# Patient Record
Sex: Male | Born: 1995 | Race: White | Hispanic: No | Marital: Single | Smoking: Never smoker
Health system: Southern US, Community
[De-identification: ages and names within clinical notes are randomized; demographics above are authoritative.]

## PROBLEM LIST (undated history)

## (undated) DIAGNOSIS — F909 Attention-deficit hyperactivity disorder, unspecified type: Secondary | ICD-10-CM

## (undated) DIAGNOSIS — F419 Anxiety disorder, unspecified: Secondary | ICD-10-CM

## (undated) HISTORY — DX: Attention-deficit hyperactivity disorder, unspecified type: F90.9

## (undated) HISTORY — DX: Anxiety disorder, unspecified: F41.9

## (undated) HISTORY — PX: TONSILLECTOMY: SUR1361

---

## 2019-01-11 ENCOUNTER — Other Ambulatory Visit: Payer: Self-pay

## 2019-01-11 ENCOUNTER — Ambulatory Visit: Payer: 59 | Admitting: Psychiatry

## 2019-01-11 ENCOUNTER — Encounter: Payer: Self-pay | Admitting: Psychiatry

## 2019-01-11 VITALS — BP 160/83 | HR 92 | Temp 98.0°F | Ht 67.32 in | Wt 217.2 lb

## 2019-01-11 DIAGNOSIS — Z8659 Personal history of other mental and behavioral disorders: Secondary | ICD-10-CM

## 2019-01-11 DIAGNOSIS — F411 Generalized anxiety disorder: Secondary | ICD-10-CM | POA: Insufficient documentation

## 2019-01-11 DIAGNOSIS — F410 Panic disorder [episodic paroxysmal anxiety] without agoraphobia: Secondary | ICD-10-CM | POA: Diagnosis not present

## 2019-01-11 DIAGNOSIS — J45990 Exercise induced bronchospasm: Secondary | ICD-10-CM | POA: Insufficient documentation

## 2019-01-11 DIAGNOSIS — F5105 Insomnia due to other mental disorder: Secondary | ICD-10-CM | POA: Diagnosis not present

## 2019-01-11 MED ORDER — GUANFACINE HCL ER 1 MG PO TB24: 1.0000 mg | ORAL_TABLET | Freq: Every day | ORAL | 1 refills | Status: DC

## 2019-01-11 MED ORDER — ESCITALOPRAM OXALATE 10 MG PO TABS: 10.0000 mg | ORAL_TABLET | Freq: Every day | ORAL | 0 refills | Status: DC

## 2019-01-11 MED ORDER — HYDROXYZINE HCL 25 MG PO TABS: 12.5000 mg | ORAL_TABLET | Freq: Two times a day (BID) | ORAL | 1 refills | Status: DC | PRN

## 2019-01-11 NOTE — Progress Notes (Signed)
Psychiatric Initial Adult Assessment   Patient Identification: Terrance Daniels MRN:  [id number] Date of Evaluation:  01/11/2019 Referral Source: Marisue Ivan MD Chief Complaint:  ' I am here to establish care." Chief Complaint    Establish Care; Anxiety; Depression; Insomnia; ADHD     Visit Diagnosis:    ICD-10-CM   1. GAD (generalized anxiety disorder) F41.1 guanFACINE (INTUNIV) 1 MG TB24 ER tablet  2. Panic attacks F41.0   3. Insomnia due to mental disorder F51.05   4. History of ADHD Z86.59     History of Present Illness:  Terrance Daniels is a 23 yr old Caucasian male, lives in [place] with his mother, employed, has a history of anxiety disorder, ADHD, presented to clinic today to establish care.  Patient reports he was being treated by his primary medical doctor for anxiety symptoms since the past few months.  He reports he was initially tried on Zoloft which worked for a while.  He reports later on he was tried on Effexor which caused him side effects of racing heart rate.  2 weeks ago he was started on Lexapro and is currently on 10 mg.  He reports he does not see much benefit from the Lexapro although it is not causing any side effects.  He continues to struggle with anxiety, racing heart rate, chest pain, restlessness, being excessively worried about different things on a regular basis .  Patient reports panic attacks which can happen randomly and can last for a few minutes.  He denies avoiding any situations because of his panic symptoms.  He reports he has been struggling with this since the past 1 year or more.  He reports panic attacks is worsening.  Patient reports sleep is restless.  He reports he has no set bedtime.  He reports he was to bed at 2 AM or 3 AM in the morning.  He reports he plays on his phone when he cannot sleep.  This has been going on since the past several months.  Patient denies any depressive symptoms.  He denies any history of trauma.  He does report a  history of ADHD, diagnosed as a child.  He reports he has been on medications like Ritalin previously.  He continues to struggle with hyperactivity, restlessness, inability to focus, trouble organizing task and so on on a regular basis.  Patient denies any suicidality, homicidality or perceptual disturbances.  Patient denies any substance abuse problems.  Patient reports he has good support system from his mother and his girlfriend.  Associated Signs/Symptoms: Depression Symptoms:  denies (Hypo) Manic Symptoms:  denies Anxiety Symptoms:  Excessive Worry, Panic Symptoms, Psychotic Symptoms:  denies PTSD Symptoms: Negative  Past Psychiatric History: Patient was being treated for anxiety disorder by his primary medical doctor.  He also has a history of ADHD.  Previous Psychotropic Medications: Yes Zoloft, Effexor-racing heart rate, Ritalin  Substance Abuse History in the last 12 months:  No.  Consequences of Substance Abuse: Negative  Past Medical History:  Past Medical History:  Diagnosis Date  . ADHD (attention deficit hyperactivity disorder)   . Anxiety     Past Surgical History:  Procedure Laterality Date  . TONSILLECTOMY      Family Psychiatric History: Mother-anxiety disorder  Family History:  Family History  Problem Relation Age of Onset  . Anxiety disorder Mother     Social History:   Social History   Socioeconomic History  . Marital status: Single    Spouse name: Not on file  .  Number of children: 0  . Years of education: Not on file  . Highest education level: High school graduate  Occupational History  . Not on file  Social Needs  . Financial resource strain: Not hard at all  . Food insecurity:    Worry: Never true    Inability: Never true  . Transportation needs:    Medical: No    Non-medical: No  Tobacco Use  . Smoking status: Never Smoker  . Smokeless tobacco: Never Used  Substance and Sexual Activity  . Alcohol use: Not Currently  .  Drug use: Not Currently    Types: Marijuana    Comment: about 3 months  . Sexual activity: Yes  Lifestyle  . Physical activity:    Days per week: 0 days    Minutes per session: 0 min  . Stress: Very much  Relationships  . Social connections:    Talks on phone: Not on file    Gets together: Not on file    Attends religious service: Never    Active member of club or organization: No    Attends meetings of clubs or organizations: Never    Relationship status: Never married  Other Topics Concern  . Not on file  Social History Narrative  . Not on file    Additional Social History: Patient lives with his mother.  He has a girlfriend.  He works at the [employer].  He lives in [place].  Graduated high school.   Allergies:  No Known Allergies  Metabolic Disorder Labs: No results found for: HGBA1C, MPG No results found for: PROLACTIN No results found for: CHOL, TRIG, HDL, CHOLHDL, VLDL, LDLCALC No results found for: TSH  Therapeutic Level Labs: No results found for: LITHIUM No results found for: CBMZ No results found for: VALPROATE  Current Medications: Current Outpatient Medications  Medication Sig Dispense Refill  . escitalopram (LEXAPRO) 10 MG tablet Take 1 tablet (10 mg total) by mouth daily. 30 tablet 0  . famotidine (PEPCID) 20 MG tablet     . ibuprofen (ADVIL,MOTRIN) 200 MG tablet Take by mouth.    . Melatonin 3 MG TABS Take by mouth.    . guanFACINE (INTUNIV) 1 MG TB24 ER tablet Take 1 tablet (1 mg total) by mouth at bedtime. FOR ANXIETY,CONCENTRATION 30 tablet 1  . hydrOXYzine (ATARAX/VISTARIL) 25 MG tablet Take 0.5-1 tablets (12.5-25 mg total) by mouth 2 (two) times daily as needed. FOR SEVERE ANXIETY ATTACKS ONLY 60 tablet 1   No current facility-administered medications for this visit.     Musculoskeletal: Strength & Muscle Tone: within normal limits Gait & Station: normal Patient leans: N/A  Psychiatric Specialty Exam: Review of Systems   Psychiatric/Behavioral: The patient is nervous/anxious and has insomnia.   All other systems reviewed and are negative.   Blood pressure (!) 160/83, pulse 92, temperature 98 F (36.7 C), temperature source Oral, height 5' 7.32" (1.71 m), weight 217 lb 3.2 oz (98.5 kg).Body mass index is 33.69 kg/m.  General Appearance: Casual  Eye Contact:  Fair  Speech:  Clear and Coherent  Volume:  Normal  Mood:  Anxious  Affect:  Appropriate  Thought Process:  Goal Directed and Descriptions of Associations: Intact  Orientation:  Full (Time, Place, and Person)  Thought Content:  Logical  Suicidal Thoughts:  No  Homicidal Thoughts:  No  Memory:  Immediate;   Fair Recent;   Fair Remote;   Fair  Judgement:  Fair  Insight:  Fair  Psychomotor Activity:  Normal  Concentration:  Concentration: Fair and Attention Span: Fair  Recall:  [employer] of Knowledge:Fair  Language: Fair  Akathisia:  No  Handed:  Right  AIMS (if indicated): denies tremors, rigidity  Assets:  Communication Skills Desire for Improvement Social Support  ADL's:  Intact  Cognition: WNL  Sleep:  Poor   Screenings:   Assessment and Plan: Terrance Daniels is a 23 yr old Caucasian male who has a history of anxiety disorder, ADHD, presented to clinic today to establish care.  Patient is biologically predisposed given his history of mental health problems in his family.  Patient will benefit from medication changes as well as psychotherapy sessions.  Plan Generalized anxiety disorder GAD 7 equals 13 Continue Lexapro 10 mg p.o. daily.  Discussed with patient to try taking it in the morning. Refer for CBT.  Panic attacks Hydroxyzine 12.5 to 25 mg p.o. twice daily as needed Refer for CBT  For history of ADHD Discussed with patient to sign a release to obtain medical records from his provider who diagnosed him with ADHD. We will start Intuniv 1 mg p.o. nightly.  For insomnia Discussed sleep hygiene techniques.  Printed out copy and  gave to patient. Continue melatonin as needed.  I have reviewed medical records in [facility] per Dr. Burnadette Pop dated 12/09/2018-' generalized anxiety disorder-patient tapered off of the Effexor.  Advised to take Lexapro 10 mg p.o. daily.  Provided list of therapist.  Referral to psychiatry.,  Sleep disturbances-multifactorial-counseled on avoiding stimulants.'  Reviewed labs-[facility]-dated 11/20/2018-within normal limits.  Patient to make an appointment with therapist here in clinic.  Will refer him for the same.  Follow-up in clinic in 2 weeks or sooner if needed.  I have spent atleast 60 minutes  face to face with patient today. More than 50 % of the time was spent for psychoeducation and supportive psychotherapy and care coordination.  This note was generated in part or whole with voice recognition software. Voice recognition is usually quite accurate but there are transcription errors that can and very often do occur. I apologize for any typographical errors that were not detected and corrected.          Jomarie Longs, MD 2/24/20205:26 PM

## 2019-01-11 NOTE — Patient Instructions (Signed)
Hydroxyzine capsules or tablets What is this medicine? HYDROXYZINE (hye DROX i zeen) is an antihistamine. This medicine is used to treat allergy symptoms. It is also used to treat anxiety and tension. This medicine can be used with other medicines to induce sleep before surgery. This medicine may be used for other purposes; ask your health care provider or pharmacist if you have questions. COMMON BRAND NAME(S): ANX, Atarax, Rezine, Vistaril What should I tell my health care provider before I take this medicine? They need to know if you have any of these conditions: -glaucoma -heart disease -history of irregular heartbeat -kidney disease -liver disease -lung or breathing disease, like asthma -stomach or intestine problems -thyroid disease -trouble passing urine -an unusual or allergic reaction to hydroxyzine, cetirizine, other medicines, foods, dyes or preservatives -pregnant or trying to get pregnant -breast-feeding How should I use this medicine? Take this medicine by mouth with a full glass of water. Follow the directions on the prescription label. You may take this medicine with food or on an empty stomach. Take your medicine at regular intervals. Do not take your medicine more often than directed. Talk to your pediatrician regarding the use of this medicine in children. Special care may be needed. While this drug may be prescribed for children as young as 6 years of age for selected conditions, precautions do apply. Patients over 65 years old may have a stronger reaction and need a smaller dose. Overdosage: If you think you have taken too much of this medicine contact a poison control center or emergency room at once. NOTE: This medicine is only for you. Do not share this medicine with others. What if I miss a dose? If you miss a dose, take it as soon as you can. If it is almost time for your next dose, take only that dose. Do not take double or extra doses. What may interact with this  medicine? Do not take this medicine with any of the following medications: -cisapride -dofetilide -dronedarone -pimozide -thioridazine This medicine may also interact with the following medications: -alcohol -antihistamines for allergy, cough, and cold -atropine -barbiturate medicines for sleep or seizures, like phenobarbital -certain antibiotics like erythromycin or clarithromycin -certain medicines for anxiety or sleep -certain medicines for bladder problems like oxybutynin, tolterodine -certain medicines for depression or psychotic disturbances -certain medicines for irregular heart beat -certain medicines for Parkinson's disease like benztropine, trihexyphenidyl -certain medicines for seizures like phenobarbital, primidone -certain medicines for stomach problems like dicyclomine, hyoscyamine -certain medicines for travel sickness like scopolamine -ipratropium -narcotic medicines for pain -other medicines that prolong the QT interval (an abnormal heart rhythm) This list may not describe all possible interactions. Give your health care provider a list of all the medicines, herbs, non-prescription drugs, or dietary supplements you use. Also tell them if you smoke, drink alcohol, or use illegal drugs. Some items may interact with your medicine. What should I watch for while using this medicine? Tell your doctor or health care professional if your symptoms do not improve. You may get drowsy or dizzy. Do not drive, use machinery, or do anything that needs mental alertness until you know how this medicine affects you. Do not stand or sit up quickly, especially if you are an older patient. This reduces the risk of dizzy or fainting spells. Alcohol may interfere with the effect of this medicine. Avoid alcoholic drinks. Your mouth may get dry. Chewing sugarless gum or sucking hard candy, and drinking plenty of water may help. Contact your doctor if   the problem does not go away or is  severe. This medicine may cause dry eyes and blurred vision. If you wear contact lenses you may feel some discomfort. Lubricating drops may help. See your eye doctor if the problem does not go away or is severe. If you are receiving skin tests for allergies, tell your doctor you are using this medicine. What side effects may I notice from receiving this medicine? Side effects that you should report to your doctor or health care professional as soon as possible: -allergic reactions like skin rash, itching or hives, swelling of the face, lips, or tongue -changes in vision -confusion -fast, irregular heartbeat -seizures -tremor -trouble passing urine or change in the amount of urine Side effects that usually do not require medical attention (report to your doctor or health care professional if they continue or are bothersome): -constipation -drowsiness -dry mouth -headache -tiredness This list may not describe all possible side effects. Call your doctor for medical advice about side effects. You may report side effects to FDA at 1-800-FDA-1088. Where should I keep my medicine? Keep out of the reach of children. Store at room temperature between 15 and 30 degrees C (59 and 86 degrees F). Keep container tightly closed. Throw away any unused medicine after the expiration date. NOTE: This sheet is a summary. It may not cover all possible information. If you have questions about this medicine, talk to your doctor, pharmacist, or health care provider.  2019 Elsevier/Gold Standard (2018-05-18 13:25:13) Guanfacine extended-release oral tablets What is this medicine? GUANFACINE [facility] fa seen) is used to treat attention-deficit hyperactivity disorder (ADHD). This medicine may be used for other purposes; ask your health care provider or pharmacist if you have questions. COMMON BRAND NAME(S): Intuniv What should I tell my health care provider before I take this medicine? They need to know if you  have any of these conditions: -high blood pressure -kidney disease -liver disease -low blood pressure -slow heart rate -an unusual or allergic reaction to guanfacine, other medicines, foods, dyes, or preservatives -pregnant or trying to get pregnant -breast-feeding How should I use this medicine? Take this medicine by mouth with a glass of water. Follow the directions on the prescription label. Do not cut, crush, or chew this medicine. Do not take this medicine with a high-fat meal. Take your medicine at regular intervals. Do not take it more often than directed. Do not stop taking except on your doctor's advice. Stopping this medicine too quickly may cause serious side effects. Ask your doctor or health care professional for advice. This drug may be prescribed for children as young as 6 years. Talk to your doctor if you have any questions. Overdosage: If you think you have taken too much of this medicine contact a poison control center or emergency room at once. NOTE: This medicine is only for you. Do not share this medicine with others. What if I miss a dose? If you miss a dose, take it as soon as you can. If it is almost time for your next dose, take only that dose. Do not take double or extra doses. If you miss 2 or more doses in a row, you should contact your doctor or health care professional. You may need to restart your medicine at a lower dose. What may interact with this medicine? -certain medicines for blood pressure, heart disease, irregular heart beat -certain medicines for depression, anxiety, or psychotic disturbances -certain medicines for seizures like carbamazepine, phenobarbital, phenytoin -certain medicines for sleep -  ketoconazole -narcotic medicines for pain -rifampin This list may not describe all possible interactions. Give your health care provider a list of all the medicines, herbs, non-prescription drugs, or dietary supplements you use. Also tell them if you smoke,  drink alcohol, or use illegal drugs. Some items may interact with your medicine. What should I watch for while using this medicine? Visit your doctor or health care professional for regular checks on your progress. Check your heart rate and blood pressure as directed. Ask your doctor or health care professional what your heart rate and blood pressure should be and when you should contact him or her. You may get dizzy or drowsy. Do not drive, use machinery, or do anything that needs mental alertness until you know how this medicine affects you. Do not stand or sit up quickly, especially if you are an older patient. This reduces the risk of dizzy or fainting spells. Alcohol can make you more drowsy and dizzy. Avoid alcoholic drinks. Avoid becoming dehydrated or overheated while taking this medicine. Tell your healthcare provider if you have been vomiting and cannot take this medicine because you may be at risk for a sudden and large increase in blood pressure called rebound hypertension. Your mouth may get dry. Chewing sugarless gum or sucking hard candy, and drinking plenty of water may help. Contact your doctor if the problem does not go away or is severe. What side effects may I notice from receiving this medicine? Side effects that you should report to your doctor or health care professional as soon as possible: -allergic reactions like skin rash, itching or hives, swelling of the face, lips, or tongue -changes in emotions or moods -chest pain or chest tightness -signs and symptoms of low blood pressure like dizziness; feeling faint or lightheaded, falls; unusually weak or tired -unusually slow heartbeat Side effects that usually do not require medical attention (report to your doctor or health care professional if they continue or are bothersome): -drowsiness -dry mouth -headache -nausea -tiredness This list may not describe all possible side effects. Call your doctor for medical advice about  side effects. You may report side effects to FDA at 1-800-FDA-1088. Where should I keep my medicine? Keep out of the reach of children. Store at room temperature between 15 and 30 degrees C (59 and 86 degrees F). Throw away any unused medicine after the expiration date. NOTE: This sheet is a summary. It may not cover all possible information. If you have questions about this medicine, talk to your doctor, pharmacist, or health care provider.  2019 Elsevier/Gold Standard (2017-02-11 19:38:26)

## 2019-01-25 ENCOUNTER — Other Ambulatory Visit: Payer: Self-pay

## 2019-01-25 ENCOUNTER — Encounter: Payer: Self-pay | Admitting: Psychiatry

## 2019-01-25 ENCOUNTER — Ambulatory Visit: Payer: 59 | Admitting: Psychiatry

## 2019-01-25 VITALS — BP 155/79 | HR 105 | Temp 98.0°F | Wt 211.6 lb

## 2019-01-25 DIAGNOSIS — Z8659 Personal history of other mental and behavioral disorders: Secondary | ICD-10-CM | POA: Diagnosis not present

## 2019-01-25 DIAGNOSIS — F411 Generalized anxiety disorder: Secondary | ICD-10-CM

## 2019-01-25 DIAGNOSIS — F410 Panic disorder [episodic paroxysmal anxiety] without agoraphobia: Secondary | ICD-10-CM | POA: Diagnosis not present

## 2019-01-25 DIAGNOSIS — F5105 Insomnia due to other mental disorder: Secondary | ICD-10-CM | POA: Diagnosis not present

## 2019-01-25 MED ORDER — ESCITALOPRAM OXALATE 10 MG PO TABS: 15.0000 mg | ORAL_TABLET | Freq: Every day | ORAL | 0 refills | Status: DC

## 2019-01-25 NOTE — Progress Notes (Signed)
BH MD OP Progress Note  01/25/2019 5:17 PM Terrance Daniels  MRN:  [id number]  Chief Complaint: ' I am here for follow up.' Chief Complaint    Follow-up     HPI: Terrance Daniels is a 23 yr old CM, lives in [place] with his mother , employed , has history of anxiety disorder, ADHD , presented to the clinic today for a follow up visit.  Patient today reports that he continues to struggle with tightness in his chest, shortness of breath, feeling shaky.  He does not know if the Lexapro is helpful.  He wonders whether the Lexapro gave him tightness of chest or not.  However looking back into his records, progress note from his primary medical doctor he has had these symptoms for a while and it does not likely related to the Lexapro.  This was discussed with patient.  Discussed readjusting his dosage of Lexapro.  Patient also advised to start psychotherapy sessions given his anxiety attacks.  He has upcoming appointment with Ms. Heidi Dach.  Patient denies any suicidality, homicidality or perceptual disturbances.  Patient reports sleep is better on the Intuniv.   Visit Diagnosis:    ICD-10-CM   1. GAD (generalized anxiety disorder) F41.1 escitalopram (LEXAPRO) 10 MG tablet  2. Panic attacks F41.0 escitalopram (LEXAPRO) 10 MG tablet  3. Insomnia due to mental disorder F51.05   4. History of ADHD Z86.59     Past Psychiatric History: I have reviewed past psychiatric history from my progress note on 01/11/2019.  Past trials of Zoloft, Effexor, Ritalin  Past Medical History:  Past Medical History:  Diagnosis Date  . ADHD (attention deficit hyperactivity disorder)   . Anxiety     Past Surgical History:  Procedure Laterality Date  . TONSILLECTOMY      Family Psychiatric History: I have reviewed family psychiatric history from my progress note on 01/11/2019  Family History:  Family History  Problem Relation Age of Onset  . Anxiety disorder Mother     Social History: I have reviewed social  history from my progress note on 01/11/2019 Social History   Socioeconomic History  . Marital status: Single    Spouse name: Not on file  . Number of children: 0  . Years of education: Not on file  . Highest education level: High school graduate  Occupational History  . Not on file  Social Needs  . Financial resource strain: Not hard at all  . Food insecurity:    Worry: Never true    Inability: Never true  . Transportation needs:    Medical: No    Non-medical: No  Tobacco Use  . Smoking status: Never Smoker  . Smokeless tobacco: Never Used  Substance and Sexual Activity  . Alcohol use: Not Currently  . Drug use: Not Currently    Types: Marijuana    Comment: about 3 months  . Sexual activity: Yes  Lifestyle  . Physical activity:    Days per week: 0 days    Minutes per session: 0 min  . Stress: Very much  Relationships  . Social connections:    Talks on phone: Not on file    Gets together: Not on file    Attends religious service: Never    Active member of club or organization: No    Attends meetings of clubs or organizations: Never    Relationship status: Never married  Other Topics Concern  . Not on file  Social History Narrative  . Not on file  Allergies: No Known Allergies  Metabolic Disorder Labs: No results found for: HGBA1C, MPG No results found for: PROLACTIN No results found for: CHOL, TRIG, HDL, CHOLHDL, VLDL, LDLCALC No results found for: TSH  Therapeutic Level Labs: No results found for: LITHIUM No results found for: VALPROATE No components found for:  CBMZ  Current Medications: Current Outpatient Medications  Medication Sig Dispense Refill  . escitalopram (LEXAPRO) 10 MG tablet Take 1.5 tablets (15 mg total) by mouth daily. 45 tablet 0  . famotidine (PEPCID) 20 MG tablet     . guanFACINE (INTUNIV) 1 MG TB24 ER tablet Take 1 tablet (1 mg total) by mouth at bedtime. FOR ANXIETY,CONCENTRATION 30 tablet 1  . hydrOXYzine (ATARAX/VISTARIL) 25 MG  tablet Take 0.5-1 tablets (12.5-25 mg total) by mouth 2 (two) times daily as needed. FOR SEVERE ANXIETY ATTACKS ONLY 60 tablet 1  . ibuprofen (ADVIL,MOTRIN) 200 MG tablet Take by mouth.    . Melatonin 3 MG TABS Take by mouth.     No current facility-administered medications for this visit.      Musculoskeletal: Strength & Muscle Tone: within normal limits Gait & Station: normal Patient leans: N/A  Psychiatric Specialty Exam: Review of Systems  Psychiatric/Behavioral: The patient is nervous/anxious.   All other systems reviewed and are negative.   Blood pressure (!) 155/79, pulse (!) 105, temperature 98 F (36.7 C), temperature source Oral, weight 211 lb 9.6 oz (96 kg).Body mass index is 32.82 kg/m.  General Appearance: Casual  Eye Contact:  Fair  Speech:  Normal Rate  Volume:  Normal  Mood:  Anxious  Affect:  Appropriate  Thought Process:  Goal Directed and Descriptions of Associations: Intact  Orientation:  Full (Time, Place, and Person)  Thought Content: Logical   Suicidal Thoughts:  No  Homicidal Thoughts:  No  Memory:  Immediate;   Fair Recent;   Fair Remote;   Fair  Judgement:  Fair  Insight:  Fair  Psychomotor Activity:  Normal  Concentration:  Concentration: Fair and Attention Span: Fair  Recall:  [employer] of Knowledge: Fair  Language: Fair  Akathisia:  No  Handed:  Right  AIMS (if indicated):denies tremors, stiffness, rigidity  Assets:  Communication Skills Desire for Improvement Social Support  ADL's:  Intact  Cognition: WNL  Sleep:  restless   Screenings:   Assessment and Plan: Terrance Daniels is a 23 yr old Caucasian male who has a history of anxiety disorder, ADHD, presented to clinic today for a follow-up visit.  Patient continues to struggle with anxiety symptoms.  We will continue to make medication changes.  Plan Generalized anxiety disorder-unstable Increase Lexapro to 15 mg p.o. daily Patient will start psychotherapy sessions soon.  For panic  attacks-unstable Hydroxyzine 12.5 to 25 mg p.o. twice daily PRN.  He has not started taking it yet. Referred for CBT.  For history of ADHD-some improvement Discussed with patient to get records from his previous provider. He will continue Intuniv for now.  Follow-up in clinic in 4 weeks or sooner if needed.  I have spent atleast 15 minutes  face to face with patient today. More than 50 % of the time was spent for psychoeducation and supportive psychotherapy and care coordination.  This note was generated in part or whole with voice recognition software. Voice recognition is usually quite accurate but there are transcription errors that can and very often do occur. I apologize for any typographical errors that were not detected and corrected.  Jomarie Longs, MD 01/25/2019, 5:17 PM

## 2019-02-05 ENCOUNTER — Telehealth: Payer: Self-pay

## 2019-02-05 NOTE — Telephone Encounter (Signed)
Medication management - Attempted to contact patient after a message was left the previous night stating he was having problems with medications and requested a call back.  Mailbox was full and could not leave a message.

## 2019-02-05 NOTE — Telephone Encounter (Signed)
Medication management - Patient stated when he went up on Lexapro to 1.5 a day of the 10 mg he feels really shaky about an hour or two after taking.  Questions if this needs to be changed and agreed to send to provider.

## 2019-02-05 NOTE — Telephone Encounter (Signed)
Returned call to patient. Discussed to divide his Lexapro dosage to two divided dosage during the day to help his side effects of feeling more shaky. He will call me back with concerns.

## 2019-02-10 ENCOUNTER — Ambulatory Visit: Payer: 59 | Admitting: Licensed Clinical Social Worker

## 2019-02-19 ENCOUNTER — Other Ambulatory Visit: Payer: Self-pay | Admitting: Psychiatry

## 2019-02-19 DIAGNOSIS — F411 Generalized anxiety disorder: Secondary | ICD-10-CM

## 2019-02-19 DIAGNOSIS — F410 Panic disorder [episodic paroxysmal anxiety] without agoraphobia: Secondary | ICD-10-CM

## 2019-02-22 ENCOUNTER — Ambulatory Visit: Payer: 59 | Admitting: Psychiatry

## 2019-02-24 ENCOUNTER — Encounter: Payer: Self-pay | Admitting: Licensed Clinical Social Worker

## 2019-02-24 ENCOUNTER — Ambulatory Visit (INDEPENDENT_AMBULATORY_CARE_PROVIDER_SITE_OTHER): Payer: 59 | Admitting: Licensed Clinical Social Worker

## 2019-02-24 ENCOUNTER — Other Ambulatory Visit: Payer: Self-pay

## 2019-02-24 DIAGNOSIS — F410 Panic disorder [episodic paroxysmal anxiety] without agoraphobia: Secondary | ICD-10-CM

## 2019-02-24 DIAGNOSIS — F411 Generalized anxiety disorder: Secondary | ICD-10-CM
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
## 2019-03-04 ENCOUNTER — Telehealth: Payer: Self-pay

## 2019-03-04 DIAGNOSIS — F411 Generalized anxiety disorder: Secondary | ICD-10-CM

## 2019-03-04 MED ORDER — AMITRIPTYLINE HCL 10 MG PO TABS: 20.0000 mg | ORAL_TABLET | Freq: Every day | ORAL | 0 refills | Status: DC

## 2019-03-04 NOTE — Telephone Encounter (Signed)
Called [facility] - discussed changing Lexapro to Elavil. Wean off Lexapro. Start Elavil 20 mg at bedtime. Scheduled appointment to be seen on April 27 th

## 2019-03-04 NOTE — Telephone Encounter (Signed)
pt mother called states her son is having issues with the medication.  pt not sleeping and he having alot of anxiety, and he is having bad headaches.

## 2019-03-10 ENCOUNTER — Ambulatory Visit: Payer: 59 | Admitting: Licensed Clinical Social Worker

## 2019-03-10 ENCOUNTER — Ambulatory Visit (INDEPENDENT_AMBULATORY_CARE_PROVIDER_SITE_OTHER): Payer: 59 | Admitting: Psychiatry

## 2019-03-10 ENCOUNTER — Other Ambulatory Visit: Payer: Self-pay

## 2019-03-10 ENCOUNTER — Encounter: Payer: Self-pay | Admitting: Psychiatry

## 2019-03-10 DIAGNOSIS — F1210 Cannabis abuse, uncomplicated: Secondary | ICD-10-CM

## 2019-03-10 DIAGNOSIS — F5105 Insomnia due to other mental disorder: Secondary | ICD-10-CM

## 2019-03-10 DIAGNOSIS — F410 Panic disorder [episodic paroxysmal anxiety] without agoraphobia: Secondary | ICD-10-CM

## 2019-03-10 DIAGNOSIS — Z8659 Personal history of other mental and behavioral disorders: Secondary | ICD-10-CM | POA: Diagnosis not present

## 2019-03-10 DIAGNOSIS — F411 Generalized anxiety disorder: Secondary | ICD-10-CM | POA: Diagnosis not present

## 2019-03-10 MED ORDER — AMITRIPTYLINE HCL 10 MG PO TABS: 20.0000 mg | ORAL_TABLET | Freq: Every day | ORAL | 1 refills | Status: DC

## 2019-03-10 MED ORDER — GUANFACINE HCL ER 1 MG PO TB24: 1.0000 mg | ORAL_TABLET | Freq: Every day | ORAL | 1 refills | Status: DC

## 2019-03-10 NOTE — Progress Notes (Signed)
Virtual Visit via Telephone Note  I connected with Terrance Daniels on 03/10/19 at  1:45 PM EDT by telephone and verified that I am speaking with the correct person using two identifiers.   I discussed the limitations, risks, security and privacy concerns of performing an evaluation and management service by telephone and the availability of in person appointments. I also discussed with the patient that there may be a patient responsible charge related to this service. The patient expressed understanding and agreed to proceed.    I discussed the assessment and treatment plan with the patient. The patient was provided an opportunity to ask questions and all were answered. The patient agreed with the plan and demonstrated an understanding of the instructions.   The patient was advised to call back or seek an in-person evaluation if the symptoms worsen or if the condition fails to improve as anticipated.   BH MD OP Progress Note  03/10/2019 2:04 PM Terrance Daniels  MRN:  [id number]  Chief Complaint:  Chief Complaint    Follow-up; Anxiety     HPI: Terrance Daniels is a 23 year old Caucasian male, lives in [place] with his mother, employed, has a history of anxiety disorder, ADHD, was evaluated by phone today.  Patient today reports that he is taking the Elavil as prescribed.  He reports his shakiness and other anxiety related symptoms that he had while he was taking the Lexapro has decreased.  He feels much better now.  He reports his sleep may also have improved.  He however reports he continues to go to bed really late at around 12 AM.  Patient reports he did not take the Intuniv for 2 days and he felt really sick.  Discussed with patient that it may have been some withdrawal symptoms that he may have noticed.  He reports he feels better since he is back on the medication.  Patient is worried about interaction between Elavil and Intuniv.  Discussed with patient that tricyclic antidepressants can  reduce the antihypertensive effect of medications like Intuniv.  Otherwise there is no significant side effect.  Patient continues to use cannabis once a week or so.  Provided substance abuse counseling.  Discussed sleep hygiene techniques with patient.  Discussed with him to avoid coffee, caffeine, alcohol, cannabis.  Also discussed with him to start working on his bedtime and wake up time.  Discussed with him to avoid stimulating activities like TV, computer and so on at least 3 to 4 hours prior to bedtime.  Patient continues to have good support system from his mother.   Visit Diagnosis:    ICD-10-CM   1. GAD (generalized anxiety disorder) F41.1 guanFACINE (INTUNIV) 1 MG TB24 ER tablet  2. Panic attack F41.0 amitriptyline (ELAVIL) 10 MG tablet  3. Insomnia due to mental disorder F51.05   4. History of ADHD Z86.59   5. Cannabis abuse, episodic F12.10     Past Psychiatric History: I have reviewed past psychiatric history from my progress note on 01/11/2019.  Past trials of Zoloft, Effexor, Ritalin.  Past Medical History:  Past Medical History:  Diagnosis Date  . ADHD (attention deficit hyperactivity disorder)   . Anxiety     Past Surgical History:  Procedure Laterality Date  . TONSILLECTOMY      Family Psychiatric History: I have reviewed family psychiatric history from my progress note on 01/11/2019.  Family History:  Family History  Problem Relation Age of Onset  . Anxiety disorder Mother     Social History:  Reviewed social history from my progress note on 01/11/2019 Social History   Socioeconomic History  . Marital status: Single    Spouse name: Not on file  . Number of children: 0  . Years of education: Not on file  . Highest education level: High school graduate  Occupational History  . Not on file  Social Needs  . Financial resource strain: Not hard at all  . Food insecurity:    Worry: Never true    Inability: Never true  . Transportation needs:    Medical:  No    Non-medical: No  Tobacco Use  . Smoking status: Never Smoker  . Smokeless tobacco: Never Used  Substance and Sexual Activity  . Alcohol use: Not Currently  . Drug use: Not Currently    Types: Marijuana    Comment: about 3 months  . Sexual activity: Yes  Lifestyle  . Physical activity:    Days per week: 0 days    Minutes per session: 0 min  . Stress: Very much  Relationships  . Social connections:    Talks on phone: Not on file    Gets together: Not on file    Attends religious service: Never    Active member of club or organization: No    Attends meetings of clubs or organizations: Never    Relationship status: Never married  Other Topics Concern  . Not on file  Social History Narrative  . Not on file    Allergies: No Known Allergies  Metabolic Disorder Labs: No results found for: HGBA1C, MPG No results found for: PROLACTIN No results found for: CHOL, TRIG, HDL, CHOLHDL, VLDL, LDLCALC No results found for: TSH  Therapeutic Level Labs: No results found for: LITHIUM No results found for: VALPROATE No components found for:  CBMZ  Current Medications: Current Outpatient Medications  Medication Sig Dispense Refill  . amitriptyline (ELAVIL) 10 MG tablet Take 2 tablets (20 mg total) by mouth at bedtime. 60 tablet 1  . budesonide-formoterol (SYMBICORT) 160-4.5 MCG/ACT inhaler Inhale into the lungs.    Marland Kitchen. guanFACINE (INTUNIV) 1 MG TB24 ER tablet Take 1 tablet (1 mg total) by mouth at bedtime. FOR ANXIETY,CONCENTRATION 30 tablet 1   No current facility-administered medications for this visit.      Musculoskeletal: Strength & Muscle Tone: UTA Gait & Station: UTA Patient leans: N/A  Psychiatric Specialty Exam: Review of Systems  Psychiatric/Behavioral: The patient is nervous/anxious.   All other systems reviewed and are negative.   There were no vitals taken for this visit.There is no height or weight on file to calculate BMI.  General Appearance: UTA  Eye  Contact:  UTA  Speech:  Normal Rate  Volume:  Normal  Mood:  Anxious  Affect:  UTA  Thought Process:  Goal Directed and Descriptions of Associations: Intact  Orientation:  Full (Time, Place, and Person)  Thought Content: Logical   Suicidal Thoughts:  No  Homicidal Thoughts:  No  Memory:  Immediate;   Fair Recent;   Fair Remote;   Fair  Judgement:  Fair  Insight:  Fair  Psychomotor Activity:  UTA  Concentration:  Concentration: Fair and Attention Span: Fair  Recall:  [employer]  Fund of Knowledge: Fair  Language: Fair  Akathisia:  No  Handed:  Right  AIMS (if indicated): UTA  Assets:  Communication Skills Desire for Improvement Social Support  ADL's:  Intact  Cognition: WNL  Sleep:  Improving   Screenings:   Assessment and Plan: Terrance Daniels is  a 23 year old Caucasian male, has a history of generalized anxiety disorder, panic attacks, history of ADHD was evaluated by phone today.  Patient today reports he is tolerating the medication well and has noticed some improvement in his anxiety and sleep problems.  We will continue plan as noted below.  Plan GAD- improving Elavil 20 mg p.o. nightly Patient was advised to start psychotherapy sessions-continue the same.  For panic attacks- improving Hydroxyzine 12.5 to 25 mg p.o. twice daily PRN.  For history of ADHD-improving Continue Intuniv for now.  Cannabis abuse episodic- provided substance abuse counseling.  Patient advised to continue CBT with Ms. Heidi Dach.  Follow-up in clinic in 1 month or sooner if needed.  I have spent atleast 15 MINUTES non face to face with patient today. More than 50 % of the time was spent for psychoeducation and supportive psychotherapy and care coordination.   This note was generated in part or whole with voice recognition software. Voice recognition is usually quite accurate but there are transcription errors that can and very often do occur. I apologize for any typographical errors that were not  detected and corrected.      Jomarie Longs, MD 03/10/2019, 2:04 PM

## 2019-03-10 NOTE — Progress Notes (Signed)
  TC on  03-10-19 @ 12:55  pt medical and surgical history were review with no updates. Pt medications and pharmacy was reviewed with updates. Pt allergies was reviewed with no changes.  No vital taken because this is a phone visit.

## 2019-03-15 ENCOUNTER — Ambulatory Visit: Payer: 59 | Admitting: Psychiatry

## 2019-03-15 ENCOUNTER — Other Ambulatory Visit: Payer: Self-pay

## 2019-03-24 ENCOUNTER — Telehealth: Payer: Self-pay

## 2019-03-24 NOTE — Telephone Encounter (Signed)
pls let him know he has to use CBD at his own risk since no real data available about drug interactions as well as other adverse effects . Thanks

## 2019-03-24 NOTE — Telephone Encounter (Signed)
pt wants to know if he can use CBD with his medications

## 2019-03-25 NOTE — Telephone Encounter (Signed)
Pt was notified.  

## 2019-04-05 ENCOUNTER — Telehealth: Payer: Self-pay

## 2019-04-05 NOTE — Telephone Encounter (Signed)
Patient reports he has GI side effects to Intuniv. Discussed stopping it and he will monitor self.

## 2019-04-05 NOTE — Telephone Encounter (Signed)
pt called left a message that he thinks he having side affect to his medication . he just doesnt feel right.

## 2019-04-06 ENCOUNTER — Other Ambulatory Visit: Payer: Self-pay

## 2019-04-06 ENCOUNTER — Ambulatory Visit: Payer: 59 | Admitting: Licensed Clinical Social Worker

## 2019-04-09 ENCOUNTER — Other Ambulatory Visit: Payer: Self-pay

## 2019-04-09 ENCOUNTER — Ambulatory Visit (INDEPENDENT_AMBULATORY_CARE_PROVIDER_SITE_OTHER): Payer: 59 | Admitting: Psychiatry

## 2019-04-09 ENCOUNTER — Encounter: Payer: Self-pay | Admitting: Psychiatry

## 2019-04-09 DIAGNOSIS — Z8659 Personal history of other mental and behavioral disorders: Secondary | ICD-10-CM

## 2019-04-09 DIAGNOSIS — F1210 Cannabis abuse, uncomplicated: Secondary | ICD-10-CM

## 2019-04-09 DIAGNOSIS — F410 Panic disorder [episodic paroxysmal anxiety] without agoraphobia: Secondary | ICD-10-CM

## 2019-04-09 DIAGNOSIS — F5105 Insomnia due to other mental disorder: Secondary | ICD-10-CM

## 2019-04-09 DIAGNOSIS — F411 Generalized anxiety disorder: Secondary | ICD-10-CM

## 2019-04-09 MED ORDER — AMITRIPTYLINE HCL 10 MG PO TABS: 30.0000 mg | ORAL_TABLET | Freq: Every day | ORAL | 1 refills | Status: DC

## 2019-04-09 NOTE — Progress Notes (Signed)
Virtual Visit via Video Note  I connected with Terrance Daniels on 04/09/19 at 11:00 AM EDT by a video enabled telemedicine application and verified that I am speaking with the correct person using two identifiers.   I discussed the limitations of evaluation and management by telemedicine and the availability of in person appointments. The patient expressed understanding and agreed to proceed.   I discussed the assessment and treatment plan with the patient. The patient was provided an opportunity to ask questions and all were answered. The patient agreed with the plan and demonstrated an understanding of the instructions.   The patient was advised to call back or seek an in-person evaluation if the symptoms worsen or if the condition fails to improve as anticipated.   BH MD OP Progress Note  04/09/2019 12:50 PM Terrance MillinerJarred A Daniels  MRN:  [id number]  Chief Complaint:  Chief Complaint    Follow-up     HPI: Terrance Daniels is a 23 year old Caucasian male, lives in [place] with his mother, employed, has a history of GAD, panic attacks, insomnia, ADHD was evaluated by telemedicine today.  Patient reports he has been taking his Elavil as prescribed.  He is currently on 20 mg.  He has not noticed any side effects yet.  He reports he however continues to feel anxious especially towards late afternoon.  Patient has stopped taking Intuniv since he had GI side effects.  Patient reports he continues to struggle with sleep.  He reports he takes the Elavil around 10 PM.  He however does not go to bed until 4 AM in the morning.  Patient reports he spends his time playing video games and other activities at night.  Some time was spent educating patient about sleep hygiene.  Patient agrees to go to bed at an earlier time and also to set a good bedtime and a wake up time.  Discussed readjusting his Elavil dosage to 30 mg and he agrees with plan.  Patient reports work is going well.  He works at [employer].  Patient  continues to have good support system from his mother.  Patient denies any other concerns today. Visit Diagnosis:    ICD-10-CM   1. GAD (generalized anxiety disorder) F41.1 amitriptyline (ELAVIL) 10 MG tablet  2. Panic attack F41.0 amitriptyline (ELAVIL) 10 MG tablet  3. Insomnia due to mental disorder F51.05 amitriptyline (ELAVIL) 10 MG tablet  4. Cannabis abuse, episodic F12.10   5. History of ADHD Z86.59     Past Psychiatric History: I have reviewed past psychiatric history from my progress note on 01/11/2019.  Past trials of Zoloft, Effexor, Ritalin.  Past Medical History:  Past Medical History:  Diagnosis Date  . ADHD (attention deficit hyperactivity disorder)   . Anxiety     Past Surgical History:  Procedure Laterality Date  . TONSILLECTOMY      Family Psychiatric History: Reviewed family psychiatric history from my progress note on 01/11/2019.  Family History:  Family History  Problem Relation Age of Onset  . Anxiety disorder Mother     Social History: Reviewed social history from my progress note on 01/11/2019. Social History   Socioeconomic History  . Marital status: Single    Spouse name: Not on file  . Number of children: 0  . Years of education: Not on file  . Highest education level: High school graduate  Occupational History  . Not on file  Social Needs  . Financial resource strain: Not hard at all  . Food insecurity:  Worry: Never true    Inability: Never true  . Transportation needs:    Medical: No    Non-medical: No  Tobacco Use  . Smoking status: Never Smoker  . Smokeless tobacco: Never Used  Substance and Sexual Activity  . Alcohol use: Not Currently  . Drug use: Not Currently    Types: Marijuana    Comment: about 3 months  . Sexual activity: Yes  Lifestyle  . Physical activity:    Days per week: 0 days    Minutes per session: 0 min  . Stress: Very much  Relationships  . Social connections:    Talks on phone: Not on file    Gets  together: Not on file    Attends religious service: Never    Active member of club or organization: No    Attends meetings of clubs or organizations: Never    Relationship status: Never married  Other Topics Concern  . Not on file  Social History Narrative  . Not on file    Allergies: No Known Allergies  Metabolic Disorder Labs: No results found for: HGBA1C, MPG No results found for: PROLACTIN No results found for: CHOL, TRIG, HDL, CHOLHDL, VLDL, LDLCALC No results found for: TSH  Therapeutic Level Labs: No results found for: LITHIUM No results found for: VALPROATE No components found for:  CBMZ  Current Medications: Current Outpatient Medications  Medication Sig Dispense Refill  . amitriptyline (ELAVIL) 10 MG tablet Take 2 tablets (20 mg total) by mouth at bedtime. 60 tablet 1  . amitriptyline (ELAVIL) 10 MG tablet Take 3 tablets (30 mg total) by mouth at bedtime. 90 tablet 1  . budesonide-formoterol (SYMBICORT) 160-4.5 MCG/ACT inhaler Inhale into the lungs.     No current facility-administered medications for this visit.      Musculoskeletal: Strength & Muscle Tone: within normal limits Gait & Station: normal Patient leans: N/A  Psychiatric Specialty Exam: Review of Systems  Psychiatric/Behavioral: The patient has insomnia.   All other systems reviewed and are negative.   There were no vitals taken for this visit.There is no height or weight on file to calculate BMI.  General Appearance: Casual  Eye Contact:  Fair  Speech:  Clear and Coherent  Volume:  Normal  Mood:  Anxious  Affect:  Congruent  Thought Process:  Goal Directed and Descriptions of Associations: Intact  Orientation:  Full (Time, Place, and Person)  Thought Content: Logical   Suicidal Thoughts:  No  Homicidal Thoughts:  No  Memory:  Immediate;   Fair Recent;   Fair Remote;   Fair  Judgement:  Fair  Insight:  Fair  Psychomotor Activity:  Normal  Concentration:  Concentration: Fair and  Attention Span: Fair  Recall:  [employer] of Knowledge: Fair  Language: Fair  Akathisia:  No  Handed:  Right  AIMS (if indicated): Denies tremors, rigidity, stiffness  Assets:  Communication Skills Desire for Improvement Social Support  ADL's:  Intact  Cognition: WNL  Sleep:  Poor   Screenings:   Assessment and Plan: Terrance Daniels is a 23 year old Caucasian male who has a history of GAD, panic attacks, history of ADHD was evaluated by telemedicine today.  Patient reports he is tolerating his Elavil well however continues to have anxiety and sleep problems.  Discussed plan as noted below.  Plan GAD-unstable Increase Elavil to 30 mg p.o. nightly Patient was advised to start psychotherapy sessions.  For panic attacks- some improvement Hydroxyzine 12.5-25 mg p.o. twice daily as  needed  For insomnia-unstable Elavil will help.  He will start working on his sleep hygiene.  For history of ADHD-patient advised to obtain medical records from his previous provider  Cannabis abuse episodic- provided substance abuse counseling.  Follow-up in clinic in 3 to 4 weeks or sooner if needed.  Appointment scheduled for June 26 at 10:15 AM.  I have spent atleast 15 minutes non face to face with patient today. More than 50 % of the time was spent for psychoeducation and supportive psychotherapy and care coordination.  This note was generated in part or whole with voice recognition software. Voice recognition is usually quite accurate but there are transcription errors that can and very often do occur. I apologize for any typographical errors that were not detected and corrected.        Jomarie Longs, MD 04/09/2019, 12:50 PM

## 2019-05-14 ENCOUNTER — Ambulatory Visit (INDEPENDENT_AMBULATORY_CARE_PROVIDER_SITE_OTHER): Payer: 59 | Admitting: Psychiatry

## 2019-05-14 ENCOUNTER — Other Ambulatory Visit: Payer: Self-pay

## 2019-05-14 ENCOUNTER — Encounter: Payer: Self-pay | Admitting: Psychiatry

## 2019-05-14 DIAGNOSIS — Z8659 Personal history of other mental and behavioral disorders: Secondary | ICD-10-CM

## 2019-05-14 DIAGNOSIS — F1210 Cannabis abuse, uncomplicated: Secondary | ICD-10-CM

## 2019-05-14 DIAGNOSIS — F5105 Insomnia due to other mental disorder: Secondary | ICD-10-CM | POA: Diagnosis not present

## 2019-05-14 DIAGNOSIS — F411 Generalized anxiety disorder: Secondary | ICD-10-CM | POA: Diagnosis not present

## 2019-05-14 DIAGNOSIS — F410 Panic disorder [episodic paroxysmal anxiety] without agoraphobia: Secondary | ICD-10-CM

## 2019-05-14 MED ORDER — AMITRIPTYLINE HCL 10 MG PO TABS: 30.0000 mg | ORAL_TABLET | Freq: Every day | ORAL | 2 refills | Status: DC

## 2019-05-14 NOTE — Progress Notes (Signed)
Virtual Visit via Video Note  I connected with Terrance Daniels on 05/14/19 at 10:15 AM EDT by a video enabled telemedicine application and verified that I am speaking with the correct person using two identifiers.   I discussed the limitations of evaluation and management by telemedicine and the availability of in person appointments. The patient expressed understanding and agreed to proceed.   I discussed the assessment and treatment plan with the patient. The patient was provided an opportunity to ask questions and all were answered. The patient agreed with the plan and demonstrated an understanding of the instructions.   The patient was advised to call back or seek an in-person evaluation if the symptoms worsen or if the condition fails to improve as anticipated.  BH MD OP Progress Note  05/14/2019 12:24 PM Terrance Daniels  MRN:  [id number]  Chief Complaint:  Chief Complaint    Follow-up     HPI: Terrance Daniels is a 23 year old Caucasian male, lives in [place] with his mother, employed, has a history of GAD, panic attacks, insomnia ADHD per history was evaluated by telemedicine today.  Patient reports he is currently doing well on the current medication regimen.  He denies any significant mood lability, anxiety or panic attacks.  He reports he likes the effect of Elavil.  He currently takes 30 mg at bedtime.  He reports he does have some dry mouth otherwise denies any other significant concerns.  Discussed with patient to drink more water as well as to chew on hard candy.  He agrees with plan.  He reports sleep is improved.  He continues to work and reports work is going well.  Patient reports he has not been smoking a lot of cannabis and is trying to stay away from it as much as possible.  He denies any other concerns today. Visit Diagnosis:    ICD-10-CM   1. GAD (generalized anxiety disorder)  F41.1 amitriptyline (ELAVIL) 10 MG tablet  2. Panic attack  F41.0 amitriptyline (ELAVIL) 10  MG tablet  3. Insomnia due to mental disorder  F51.05 amitriptyline (ELAVIL) 10 MG tablet  4. Cannabis abuse, episodic  F12.10   5. History of ADHD  Z86.59     Past Psychiatric History: I have reviewed past psychiatric history from my progress note on 01/11/2019.  Past trials of Zoloft, Effexor, Ritalin.  Past Medical History:  Past Medical History:  Diagnosis Date  . ADHD (attention deficit hyperactivity disorder)   . Anxiety     Past Surgical History:  Procedure Laterality Date  . TONSILLECTOMY      Family Psychiatric History: I have reviewed family psychiatric history from my progress note on 01/11/2019.  Family History:  Family History  Problem Relation Age of Onset  . Anxiety disorder Mother     Social History: I have reviewed social history from my progress note on 01/11/2019. Social History   Socioeconomic History  . Marital status: Single    Spouse name: Not on file  . Number of children: 0  . Years of education: Not on file  . Highest education level: High school graduate  Occupational History  . Not on file  Social Needs  . Financial resource strain: Not hard at all  . Food insecurity    Worry: Never true    Inability: Never true  . Transportation needs    Medical: No    Non-medical: No  Tobacco Use  . Smoking status: Never Smoker  . Smokeless tobacco: Never Used  Substance  and Sexual Activity  . Alcohol use: Not Currently  . Drug use: Not Currently    Types: Marijuana    Comment: about 3 months  . Sexual activity: Yes  Lifestyle  . Physical activity    Days per week: 0 days    Minutes per session: 0 min  . Stress: Very much  Relationships  . Social Musicianconnections    Talks on phone: Not on file    Gets together: Not on file    Attends religious service: Never    Active member of club or organization: No    Attends meetings of clubs or organizations: Never    Relationship status: Never married  Other Topics Concern  . Not on file  Social  History Narrative  . Not on file    Allergies: No Known Allergies  Metabolic Disorder Labs: No results found for: HGBA1C, MPG No results found for: PROLACTIN No results found for: CHOL, TRIG, HDL, CHOLHDL, VLDL, LDLCALC No results found for: TSH  Therapeutic Level Labs: No results found for: LITHIUM No results found for: VALPROATE No components found for:  CBMZ  Current Medications: Current Outpatient Medications  Medication Sig Dispense Refill  . amitriptyline (ELAVIL) 10 MG tablet Take 3 tablets (30 mg total) by mouth at bedtime. 90 tablet 2  . budesonide-formoterol (SYMBICORT) 160-4.5 MCG/ACT inhaler Inhale into the lungs.     No current facility-administered medications for this visit.      Musculoskeletal: Strength & Muscle Tone: within normal limits Gait & Station: normal Patient leans: N/A  Psychiatric Specialty Exam: Review of Systems  Psychiatric/Behavioral: The patient is nervous/anxious.   All other systems reviewed and are negative.   There were no vitals taken for this visit.There is no height or weight on file to calculate BMI.  General Appearance: Casual  Eye Contact:  Fair  Speech:  Clear and Coherent  Volume:  Normal  Mood:  Anxious  Affect:  Congruent  Thought Process:  Goal Directed and Descriptions of Associations: Intact  Orientation:  Full (Time, Place, and Person)  Thought Content: Logical   Suicidal Thoughts:  No  Homicidal Thoughts:  No  Memory:  Immediate;   Fair Recent;   Fair Remote;   Fair  Judgement:  Fair  Insight:  Fair  Psychomotor Activity:  Normal  Concentration:  Concentration: Fair and Attention Span: Fair  Recall:  Terrance Daniels  Fund of Knowledge: Fair  Language: Fair  Akathisia:  No  Handed:  Right  AIMS (if indicated): Denies tremors, rigidity, stiffness  Assets:  Communication Skills Desire for Improvement Social Support  ADL's:  Intact  Cognition: WNL  Sleep:  Fair   Screenings:   Assessment and Plan: Terrance Daniels is  a 23 year old Caucasian male who has a history of GAD, panic attacks, history of ADHD was evaluated by telemedicine today.  Patient reports he is currently doing well on the current medication regimen and reports mood and sleep is improved.  Plan as noted below.  Plan For GAD-improving Elavil 30 mg p.o. nightly Patient advised to start psychotherapy sessions-pending  For panic attacks-improving Hydroxyzine 12.5 to 25 mg p.o. twice daily as needed  For insomnia-improving Elavil helps.  For history of ADHD-patient was advised to obtain medical records from previous provider-pending  Cannabis abuse episodic- patient reports he has been trying to stay away.  Provided substance abuse counseling.  Follow-up in clinic in 1 month or sooner if needed.  August 28 at 10:30 AM  I have spent atleast 15 minutes  non face to face with patient today. More than 50 % of the time was spent for psychoeducation and supportive psychotherapy and care coordination.  This note was generated in part or whole with voice recognition software. Voice recognition is usually quite accurate but there are transcription errors that can and very often do occur. I apologize for any typographical errors that were not detected and corrected.          Ursula Alert, MD 05/14/2019, 12:24 PM

## 2019-07-16 ENCOUNTER — Other Ambulatory Visit: Payer: Self-pay

## 2019-07-16 ENCOUNTER — Ambulatory Visit (INDEPENDENT_AMBULATORY_CARE_PROVIDER_SITE_OTHER): Payer: Self-pay | Admitting: Psychiatry

## 2019-07-16 DIAGNOSIS — Z5329 Procedure and treatment not carried out because of patient's decision for other reasons: Secondary | ICD-10-CM

## 2019-07-16 NOTE — Progress Notes (Signed)
No response to call 

## 2019-09-17 ENCOUNTER — Other Ambulatory Visit: Payer: Self-pay

## 2019-09-17 ENCOUNTER — Emergency Department: Payer: Managed Care, Other (non HMO)

## 2019-09-17 DIAGNOSIS — Z79899 Other long term (current) drug therapy: Secondary | ICD-10-CM | POA: Insufficient documentation

## 2019-09-17 DIAGNOSIS — N23 Unspecified renal colic: Secondary | ICD-10-CM | POA: Insufficient documentation

## 2019-09-17 DIAGNOSIS — R1032 Left lower quadrant pain: Secondary | ICD-10-CM | POA: Diagnosis present

## 2019-09-17 LAB — COMPREHENSIVE METABOLIC PANEL
ALT: 17 U/L (ref 0–44)
AST: 16 U/L (ref 15–41)
Albumin: 5 g/dL (ref 3.5–5.0)
Alkaline Phosphatase: 70 U/L (ref 38–126)
Anion gap: 13 (ref 5–15)
BUN: 13 mg/dL (ref 6–20)
CO2: 25 mmol/L (ref 22–32)
Calcium: 9.4 mg/dL (ref 8.9–10.3)
Chloride: 102 mmol/L (ref 98–111)
Creatinine, Ser: 1.13 mg/dL (ref 0.61–1.24)
GFR calc Af Amer: >60 mL/min (ref >60)
GFR calc non Af Amer: >60 mL/min (ref >60)
Glucose, Bld: 116 mg/dL — ABNORMAL HIGH (ref 70–99)
Potassium: 3.7 mmol/L (ref 3.5–5.1)
Sodium: 140 mmol/L (ref 135–145)
Total Bilirubin: 0.7 mg/dL (ref 0.3–1.2)
Total Protein: 8.2 g/dL — ABNORMAL HIGH (ref 6.5–8.1)

## 2019-09-17 LAB — URINALYSIS, COMPLETE (UACMP) WITH MICROSCOPIC
Bacteria, UA: NONE SEEN
Bilirubin Urine: NEGATIVE
Glucose, UA: NEGATIVE mg/dL
Ketones, ur: 5 mg/dL — AB
Leukocytes,Ua: NEGATIVE
Nitrite: NEGATIVE
Protein, ur: 30 mg/dL — AB
RBC / HPF: >50 RBC/hpf — ABNORMAL HIGH (ref 0–5)
Specific Gravity, Urine: 1.024 (ref 1.005–1.030)
pH: 6 (ref 5.0–8.0)

## 2019-09-17 LAB — CBC
HCT: 46.9 % (ref 39.0–52.0)
Hemoglobin: 16.3 g/dL (ref 13.0–17.0)
MCH: 31.3 pg (ref 26.0–34.0)
MCHC: 34.8 g/dL (ref 30.0–36.0)
MCV: 90 fL (ref 80.0–100.0)
Platelets: 366 10*3/uL (ref 150–400)
RBC: 5.21 MIL/uL (ref 4.22–5.81)
RDW: 11.9 % (ref 11.5–15.5)
WBC: 7.6 10*3/uL (ref 4.0–10.5)
nRBC: 0 % (ref 0.0–0.2)

## 2019-09-17 LAB — LIPASE, BLOOD: Lipase: 19 U/L (ref 11–51)

## 2019-09-17 NOTE — ED Triage Notes (Signed)
Patient c/o LLQ abdominal pain, N/V. Patient denies radiation. Patient denies diarrhea. Patient denies urinary changes.

## 2019-09-17 NOTE — ED Notes (Signed)
Patient to stat desk asking about wait time. Patient given update on wait time. Patient verbalizes understanding.  

## 2019-09-17 NOTE — ED Notes (Signed)
Patient to stat desk asking about wait time. Patient given an update on wait time. Patient verbalizes understanding.

## 2019-09-17 NOTE — ED Notes (Signed)
Patient transported to CT 

## 2019-09-18 ENCOUNTER — Emergency Department
Admission: EM | Admit: 2019-09-18 | Discharge: 2019-09-18 | Disposition: A | Discharge status: Home / Self Care | Payer: Managed Care, Other (non HMO) | Attending: Emergency Medicine | Admitting: Emergency Medicine

## 2019-09-18 DIAGNOSIS — N23 Unspecified renal colic: Secondary | ICD-10-CM

## 2019-09-18 MED ORDER — OXYCODONE-ACETAMINOPHEN 5-325 MG PO TABS
1.0000 | ORAL_TABLET | Freq: Once | ORAL | Status: AC
  Administered 2019-09-18: 1 via ORAL
  Filled 2019-09-18: qty 1

## 2019-09-18 MED ORDER — TAMSULOSIN HCL 0.4 MG PO CAPS: 0.4000 mg | ORAL_CAPSULE | Freq: Every day | ORAL | 0 refills | Status: DC

## 2019-09-18 MED ORDER — SODIUM CHLORIDE 0.9 % IV BOLUS
1000.0000 mL | Freq: Once | INTRAVENOUS | Status: AC
  Administered 2019-09-18: 1000 mL via INTRAVENOUS

## 2019-09-18 MED ORDER — OXYCODONE-ACETAMINOPHEN 5-325 MG PO TABS: 1.0000 | ORAL_TABLET | ORAL | 0 refills | Status: DC | PRN

## 2019-09-18 MED ORDER — KETOROLAC TROMETHAMINE 30 MG/ML IJ SOLN
10.0000 mg | Freq: Once | INTRAMUSCULAR | Status: AC
  Administered 2019-09-18: 9.9 mg via INTRAVENOUS
  Filled 2019-09-18: qty 1

## 2019-09-18 MED ORDER — ONDANSETRON 4 MG PO TBDP
ORAL_TABLET | ORAL | Status: AC
  Filled 2019-09-18: qty 1

## 2019-09-18 MED ORDER — ONDANSETRON 4 MG PO TBDP
4.0000 mg | ORAL_TABLET | Freq: Once | ORAL | Status: AC
  Administered 2019-09-18: 03:00:00 4 mg via ORAL

## 2019-09-18 MED ORDER — ONDANSETRON 4 MG PO TBDP: 4.0000 mg | ORAL_TABLET | Freq: Three times a day (TID) | ORAL | 0 refills | Status: DC | PRN

## 2019-09-18 MED ORDER — HYDROMORPHONE HCL 1 MG/ML IJ SOLN
0.5000 mg | Freq: Once | INTRAMUSCULAR | Status: AC
  Administered 2019-09-18: 0.5 mg via INTRAVENOUS
  Filled 2019-09-18: qty 1

## 2019-09-18 MED ORDER — ONDANSETRON HCL 4 MG/2ML IJ SOLN
4.0000 mg | Freq: Once | INTRAMUSCULAR | Status: AC
  Administered 2019-09-18: 4 mg via INTRAVENOUS
  Filled 2019-09-18: qty 2

## 2019-09-18 NOTE — ED Notes (Signed)
Pt reports developed sharp flank pain reports some nausea denies any vomiting pt talks in complete sentences no distress noted

## 2019-09-18 NOTE — Discharge Instructions (Signed)
1. Take pain & nausea medicines as needed (Percocet/Zofran #30). Make sure to take a stool softener while taking narcotic pain medicines. 2. Take Flomax 0.4mg daily x 14 days. 3. Drink plenty of bottled or filtered water daily. 4. Return to the ER for worsening symptoms, persistent vomiting, fever, difficulty breathing or other concerns.  

## 2019-09-18 NOTE — ED Provider Notes (Signed)
[facility] Emergency Department Provider Note   ____________________________________________   First MD Initiated Contact with Patient 09/18/19 0121     (approximate)  I have reviewed the triage vital signs and the nursing notes.   HISTORY  Chief Complaint Abdominal Pain    HPI Terrance Daniels is a 23 y.o. male who presents to the ED from work with a chief complaint of left lower quadrant abdominal pain associated with nausea and vomiting.  Patient reports sudden onset of pain while at work.  Also having difficulty urinating.  No history of kidney stones previously.  Denies fever, cough, chest pain, shortness of breath, hematuria, diarrhea.  Denies recent trauma.       Past Medical History:  Diagnosis Date  . ADHD (attention deficit hyperactivity disorder)   . Anxiety     Patient Active Problem List   Diagnosis Date Noted  . Panic attack 05/14/2019  . Insomnia due to mental disorder 05/14/2019  . Cannabis abuse, episodic 05/14/2019  . History of ADHD 05/14/2019  . Exercise-induced asthma 01/11/2019  . GAD (generalized anxiety disorder) 01/11/2019    Past Surgical History:  Procedure Laterality Date  . TONSILLECTOMY      Prior to Admission medications   Medication Sig Start Date End Date Taking? Authorizing Provider  amitriptyline (ELAVIL) 10 MG tablet Take 3 tablets (30 mg total) by mouth at bedtime. 05/14/19   Ursula Alert, MD  budesonide-formoterol (SYMBICORT) 160-4.5 MCG/ACT inhaler Inhale into the lungs. 02/12/19 02/12/20  [provider]  ondansetron (ZOFRAN ODT) 4 MG disintegrating tablet Take 1 tablet (4 mg total) by mouth every 8 (eight) hours as needed for nausea or vomiting. 09/18/19   Paulette Blanch, MD  oxyCODONE-acetaminophen (PERCOCET/ROXICET) 5-325 MG tablet Take 1 tablet by mouth every 4 (four) hours as needed for severe pain. 09/18/19   Paulette Blanch, MD  tamsulosin (FLOMAX) 0.4 MG CAPS capsule Take 1 capsule (0.4 mg  total) by mouth daily. 09/18/19   Paulette Blanch, MD    Allergies Patient has no known allergies.  Family History  Problem Relation Age of Onset  . Anxiety disorder Mother     Social History Social History   Tobacco Use  . Smoking status: Never Smoker  . Smokeless tobacco: Never Used  Substance Use Topics  . Alcohol use: Not Currently  . Drug use: Not Currently    Types: Marijuana    Comment: about 3 months    Review of Systems  Constitutional: No fever/chills Eyes: No visual changes. ENT: No sore throat. Cardiovascular: Denies chest pain. Respiratory: Denies shortness of breath. Gastrointestinal: Positive for left lower quadrant abdominal pain, nausea and vomiting.  No diarrhea.  No constipation. Genitourinary: Negative for dysuria. Musculoskeletal: Negative for back pain. Skin: Negative for rash. Neurological: Negative for headaches, focal weakness or numbness.   ____________________________________________   PHYSICAL EXAM:  VITAL SIGNS: ED Triage Vitals  Enc Vitals Group     BP 09/17/19 2201 (!) 151/99     Pulse Rate 09/17/19 2201 77     Resp 09/17/19 2201 15     Temp 09/17/19 2201 97.6 F (36.4 C)     Temp Source 09/17/19 2201 Oral     SpO2 09/17/19 2201 100 %     Weight 09/17/19 2202 184 lb (83.5 kg)     Height 09/17/19 2202 5\' 9"  (1.753 m)     Head Circumference --      Peak Flow --  Pain Score 09/17/19 2202 10     Pain Loc --      Pain Edu? --      Excl. in GC? --     Constitutional: Alert and oriented. Well appearing and in moderate acute distress. Eyes: Conjunctivae are normal. PERRL. EOMI. Head: Atraumatic. Nose: No congestion/rhinnorhea. Mouth/Throat: Mucous membranes are moist.  Oropharynx non-erythematous. Neck: No stridor.   Cardiovascular: Normal rate, regular rhythm. Grossly normal heart sounds.  Good peripheral circulation. Respiratory: Normal respiratory effort.  No retractions. Lungs CTAB. Gastrointestinal: Soft and mildly  tender to palpation left lower quadrant without rebound or guarding. No distention. No abdominal bruits. No CVA tenderness. Musculoskeletal: No lower extremity tenderness nor edema.  No joint effusions. Neurologic:  Normal speech and language. No gross focal neurologic deficits are appreciated. No gait instability. Skin:  Skin is warm, dry and intact. No rash noted. Psychiatric: Mood and affect are normal. Speech and behavior are normal.  ____________________________________________   LABS (all labs ordered are listed, but only abnormal results are displayed)  Labs Reviewed  COMPREHENSIVE METABOLIC PANEL - Abnormal; Notable for the following components:      Result Value   Glucose, Bld 116 (*)    Total Protein 8.2 (*)    All other components within normal limits  URINALYSIS, COMPLETE (UACMP) WITH MICROSCOPIC - Abnormal; Notable for the following components:   Color, Urine YELLOW (*)    APPearance HAZY (*)    Hgb urine dipstick LARGE (*)    Ketones, ur 5 (*)    Protein, ur 30 (*)    RBC / HPF >50 (*)    All other components within normal limits  LIPASE, BLOOD  CBC   ____________________________________________  EKG  None ____________________________________________  RADIOLOGY  ED MD interpretation: 2 mm stone posterior wall of urinary bladder adjacent to the left UVJ  Official radiology report(s): Ct Renal Stone Study  Result Date: 09/18/2019 CLINICAL DATA:  23 year old male with left lower quadrant abdominal pain, nausea vomiting. EXAM: CT ABDOMEN AND PELVIS WITHOUT CONTRAST TECHNIQUE: Multidetector CT imaging of the abdomen and pelvis was performed following the standard protocol without IV contrast. COMPARISON:  None. FINDINGS: Evaluation of this exam is limited in the absence of intravenous contrast. Lower chest: The visualized lung bases are clear. No intra-abdominal free air or free fluid. Hepatobiliary: No focal liver abnormality is seen. No gallstones, gallbladder  wall thickening, or biliary dilatation. Pancreas: Unremarkable. No pancreatic ductal dilatation or surrounding inflammatory changes. Spleen: Normal in size without focal abnormality. Adrenals/Urinary Tract: The adrenal glands are unremarkable. There is a 2 mm calculus along the posterior wall of the urinary bladder adjacent to the left UVJ which may represent a recently passed left renal calculus versus a left UVJ stone. There is mild left hydronephrosis. The right kidney and right ureter appear unremarkable. Stomach/Bowel: The stomach is distended with ingested content. There is no bowel obstruction or active inflammation. The appendix is normal. Vascular/Lymphatic: The abdominal aorta and IVC are grossly unremarkable on this noncontrast CT. No portal venous gas. There is no adenopathy. Reproductive: The prostate and seminal vesicles are grossly unremarkable. Other: None Musculoskeletal: No acute or significant osseous findings. IMPRESSION: 1. A 2 mm recently passed left renal calculus versus a left UVJ stone with mild left hydronephrosis. 2. No bowel obstruction or active inflammation. Normal appendix. Electronically Signed   By: Elgie CollardArash  Radparvar M.D.   On: 09/18/2019 00:02    ____________________________________________   PROCEDURES  Procedure(s) performed (including Critical Care):  Procedures  ____________________________________________   INITIAL IMPRESSION / ASSESSMENT AND PLAN / ED COURSE  As part of my medical decision making, I reviewed the following data within the electronic MEDICAL RECORD NUMBER History obtained from family, Nursing notes reviewed and incorporated, Labs reviewed, Old chart reviewed, Radiograph reviewed, Notes from prior ED visits and [place] Controlled Substance Database     Terrance Daniels was evaluated in Emergency Department on 09/18/2019 for the symptoms described in the history of present illness. He was evaluated in the context of the global COVID-19 pandemic, which  necessitated consideration that the patient might be at risk for infection with the SARS-CoV-2 virus that causes COVID-19. Institutional protocols and algorithms that pertain to the evaluation of patients at risk for COVID-19 are in a state of rapid change based on information released by regulatory bodies including the CDC and federal and state organizations. These policies and algorithms were followed during the patient's care in the ED.    23 year old male who presents with sudden onset left lower quadrant abdominal pain, nausea and vomiting. Differential diagnosis includes, but is not limited to, acute appendicitis, renal colic, testicular torsion, urinary tract infection/pyelonephritis, prostatitis,  epididymitis, diverticulitis, small bowel obstruction or ileus, colitis, abdominal aortic aneurysm, gastroenteritis, hernia, etc.  Laboratory results remarkable for greater than 50 RBC in urine.  CT scan demonstrates either recently passed or left UVJ stone with mild hydronephrosis.  Will initiate IV fluid resuscitation, Toradol and Dilaudid for pain, Zofran for nausea.  Clinical Course as of Sep 17 245  Sat Sep 18, 2019  0245 No pain currently.  Will discharge home on Flomax, Percocet and Zofran.  Strict return precautions given.  Patient and mother verbalized understanding agree with plan of care.   [JS]    Clinical Course User Index [JS] Irean Hong, MD     ____________________________________________   FINAL CLINICAL IMPRESSION(S) / ED DIAGNOSES  Final diagnoses:  Renal colic on left side     ED Discharge Orders         Ordered    oxyCODONE-acetaminophen (PERCOCET/ROXICET) 5-325 MG tablet  Every 4 hours PRN     09/18/19 0147    ondansetron (ZOFRAN ODT) 4 MG disintegrating tablet  Every 8 hours PRN     09/18/19 0147    tamsulosin (FLOMAX) 0.4 MG CAPS capsule  Daily     09/18/19 0147           Note:  This document was prepared using Dragon voice recognition software and  may include unintentional dictation errors.   Irean Hong, MD 09/18/19 [phone]

## 2019-10-01 ENCOUNTER — Other Ambulatory Visit: Payer: Self-pay | Admitting: Psychiatry

## 2019-10-01 DIAGNOSIS — F5105 Insomnia due to other mental disorder: Secondary | ICD-10-CM

## 2019-10-01 DIAGNOSIS — F410 Panic disorder [episodic paroxysmal anxiety] without agoraphobia: Secondary | ICD-10-CM

## 2019-10-01 DIAGNOSIS — F411 Generalized anxiety disorder: Secondary | ICD-10-CM

## 2019-10-01 MED ORDER — AMITRIPTYLINE HCL 10 MG PO TABS: 30.0000 mg | ORAL_TABLET | Freq: Every day | ORAL | 0 refills | Status: DC

## 2019-10-01 NOTE — Telephone Encounter (Signed)
Sent Elavil for 30 days - needs appointment - will not refill if not seen.

## 2019-11-05 ENCOUNTER — Other Ambulatory Visit: Payer: Self-pay | Admitting: Psychiatry

## 2019-11-05 DIAGNOSIS — F411 Generalized anxiety disorder: Secondary | ICD-10-CM

## 2019-11-05 DIAGNOSIS — F410 Panic disorder [episodic paroxysmal anxiety] without agoraphobia: Secondary | ICD-10-CM

## 2019-11-05 DIAGNOSIS — F5105 Insomnia due to other mental disorder: Secondary | ICD-10-CM

## 2019-11-13 ENCOUNTER — Other Ambulatory Visit: Payer: Self-pay | Admitting: Psychiatry

## 2019-11-13 DIAGNOSIS — F411 Generalized anxiety disorder: Secondary | ICD-10-CM

## 2019-11-13 DIAGNOSIS — F5105 Insomnia due to other mental disorder: Secondary | ICD-10-CM

## 2019-11-13 DIAGNOSIS — F410 Panic disorder [episodic paroxysmal anxiety] without agoraphobia: Secondary | ICD-10-CM

## 2019-11-16 ENCOUNTER — Ambulatory Visit (INDEPENDENT_AMBULATORY_CARE_PROVIDER_SITE_OTHER): Payer: 59 | Admitting: Psychiatry

## 2019-11-16 ENCOUNTER — Other Ambulatory Visit: Payer: Self-pay

## 2019-11-16 ENCOUNTER — Encounter: Payer: Self-pay | Admitting: Psychiatry

## 2019-11-16 DIAGNOSIS — F411 Generalized anxiety disorder: Secondary | ICD-10-CM | POA: Diagnosis not present

## 2019-11-16 DIAGNOSIS — F5105 Insomnia due to other mental disorder: Secondary | ICD-10-CM | POA: Diagnosis not present

## 2019-11-16 DIAGNOSIS — Z8659 Personal history of other mental and behavioral disorders: Secondary | ICD-10-CM

## 2019-11-16 DIAGNOSIS — F410 Panic disorder [episodic paroxysmal anxiety] without agoraphobia: Secondary | ICD-10-CM | POA: Diagnosis not present

## 2019-11-16 DIAGNOSIS — F1210 Cannabis abuse, uncomplicated: Secondary | ICD-10-CM | POA: Diagnosis not present

## 2019-11-16 MED ORDER — AMITRIPTYLINE HCL 50 MG PO TABS: 50.0000 mg | ORAL_TABLET | Freq: Every day | ORAL | 0 refills | Status: DC

## 2019-11-16 NOTE — Progress Notes (Signed)
Virtual Visit via Video Note  I connected with Terrance Daniels on 11/16/19 at 11:15 AM EST by a video enabled telemedicine application and verified that I am speaking with the correct person using two identifiers.   I discussed the limitations of evaluation and management by telemedicine and the availability of in person appointments. The patient expressed understanding and agreed to proceed.    I discussed the assessment and treatment plan with the patient. The patient was provided an opportunity to ask questions and all were answered. The patient agreed with the plan and demonstrated an understanding of the instructions.   The patient was advised to call back or seek an in-person evaluation if the symptoms worsen or if the condition fails to improve as anticipated.   BH MD OP Progress Note  11/16/2019 12:47 PM Terrance Daniels  MRN:  [id number]  Chief Complaint:  Chief Complaint    Follow-up     HPI: Terrance Daniels is a 23 year old Caucasian male, lives in [place] with his mother, employed, has a history of GAD, panic attacks, insomnia, ADHD per history was evaluated by telemedicine today.  Patient was last seen on 05/14/2019.  Patient has been noncompliant with his follow-up sessions.  Patient today returns reporting he continues to struggle with anxiety symptoms.  He reports he worries about everything to the extreme.  He reports he is currently in a new relationship , as well as the pandemic, all of this contributes to his increased anxiety.  Patient reports sleep is good.  Patient denies any suicidality, homicidality or perceptual disturbances.  Patient reports he continues to stay away from smoking cigarettes as well as cannabis.  Patient denies any other concerns today. Visit Diagnosis:    ICD-10-CM   1. GAD (generalized anxiety disorder)  F41.1 amitriptyline (ELAVIL) 50 MG tablet  2. Panic attack  F41.0 amitriptyline (ELAVIL) 50 MG tablet  3. Insomnia due to mental disorder   F51.05 amitriptyline (ELAVIL) 50 MG tablet  4. Cannabis abuse, episodic  F12.10   5. History of ADHD  Z86.59     Past Psychiatric History: Reviewed past psychiatric history from my progress note on 01/11/2019.  Past trials of Zoloft, Effexor, Ritalin  Past Medical History:  Past Medical History:  Diagnosis Date  . ADHD (attention deficit hyperactivity disorder)   . Anxiety     Past Surgical History:  Procedure Laterality Date  . TONSILLECTOMY      Family Psychiatric History: Reviewed family psychiatric history from my progress note on 01/11/2019.  Family History:  Family History  Problem Relation Age of Onset  . Anxiety disorder Mother     Social History: Reviewed social history from my progress note on 01/11/2019. Social History   Socioeconomic History  . Marital status: Single    Spouse name: Not on file  . Number of children: 0  . Years of education: Not on file  . Highest education level: High school graduate  Occupational History  . Not on file  Tobacco Use  . Smoking status: Never Smoker  . Smokeless tobacco: Never Used  Substance and Sexual Activity  . Alcohol use: Not Currently  . Drug use: Not Currently    Types: Marijuana    Comment: about 3 months  . Sexual activity: Yes  Other Topics Concern  . Not on file  Social History Narrative  . Not on file   Social Determinants of Health   Financial Resource Strain: Low Risk   . Difficulty of Paying Living Expenses: Not  hard at all  Food Insecurity: No Food Insecurity  . Worried About Programme researcher, broadcasting/film/video in the Last Year: Never true  . Ran Out of Food in the Last Year: Never true  Transportation Needs: No Transportation Needs  . Lack of Transportation (Medical): No  . Lack of Transportation (Non-Medical): No  Physical Activity: Inactive  . Days of Exercise per Week: 0 days  . Minutes of Exercise per Session: 0 min  Stress: Stress Concern Present  . Feeling of Stress : Very much  Social Connections:  Unknown  . Frequency of Communication with Friends and Family: Not on file  . Frequency of Social Gatherings with Friends and Family: Not on file  . Attends Religious Services: Never  . Active Member of Clubs or Organizations: No  . Attends Banker Meetings: Never  . Marital Status: Never married    Allergies: No Known Allergies  Metabolic Disorder Labs: No results found for: HGBA1C, MPG No results found for: PROLACTIN No results found for: CHOL, TRIG, HDL, CHOLHDL, VLDL, LDLCALC No results found for: TSH  Therapeutic Level Labs: No results found for: LITHIUM No results found for: VALPROATE No components found for:  CBMZ  Current Medications: Current Outpatient Medications  Medication Sig Dispense Refill  . albuterol (VENTOLIN HFA) 108 (90 Base) MCG/ACT inhaler Inhale into the lungs.    Marland Kitchen amitriptyline (ELAVIL) 50 MG tablet Take 1 tablet (50 mg total) by mouth at bedtime. 90 tablet 0  . budesonide-formoterol (SYMBICORT) 160-4.5 MCG/ACT inhaler Inhale into the lungs.    . ondansetron (ZOFRAN ODT) 4 MG disintegrating tablet Take 1 tablet (4 mg total) by mouth every 8 (eight) hours as needed for nausea or vomiting. 20 tablet 0  . oxyCODONE-acetaminophen (PERCOCET/ROXICET) 5-325 MG tablet Take 1 tablet by mouth every 4 (four) hours as needed for severe pain. 30 tablet 0  . tamsulosin (FLOMAX) 0.4 MG CAPS capsule Take 1 capsule (0.4 mg total) by mouth daily. 14 capsule 0   No current facility-administered medications for this visit.     Musculoskeletal: Strength & Muscle Tone: UTA Gait & Station: normal Patient leans: N/A  Psychiatric Specialty Exam: Review of Systems  Psychiatric/Behavioral: The patient is nervous/anxious.   All other systems reviewed and are negative.   There were no vitals taken for this visit.There is no height or weight on file to calculate BMI.  General Appearance: Casual  Eye Contact:  Fair  Speech:  Clear and Coherent  Volume:  Normal   Mood:  Anxious  Affect:  Congruent  Thought Process:  Goal Directed and Descriptions of Associations: Intact  Orientation:  Full (Time, Place, and Person)  Thought Content: Logical   Suicidal Thoughts:  No  Homicidal Thoughts:  No  Memory:  Immediate;   Fair Recent;   Fair Remote;   Fair  Judgement:  Fair  Insight:  Fair  Psychomotor Activity:  Normal  Concentration:  Concentration: Fair and Attention Span: Fair  Recall:  [employer] of Knowledge: Fair  Language: Fair  Akathisia:  No  Handed:  Right  AIMS (if indicated): UTA  Assets:  Communication Skills Desire for Improvement Social Support  ADL's:  Intact  Cognition: WNL  Sleep:  Fair   Screenings:   Assessment and Plan: Saket is a 23 year old Caucasian male who has a history of GAD, panic attacks, history of ADHD was evaluated by telemedicine today.  Patient is currently struggling with anxiety symptoms and will benefit from medication readjustment.  Plan as  noted below.  Plan GAD-unstable Increase Elavil to 50 mg p.o. nightly Patient was advised to start psychotherapy sessions-noncompliant  Panic attacks-improving Hydroxyzine 12.5 to 25 mg p.o. twice daily as needed  Insomnia-improved Elavil as prescribed  Cannabis use disorder episodic-patient reports he is currently sober.  We will continue to monitor.  For history of ADHD-pending records from previous psychiatrist.  Follow-up in clinic in 4 weeks or sooner if needed.  February 11 at 10:40 am .  I have spent atleast 15 minutes non face to face with patient today. More than 50 % of the time was spent for psychoeducation and supportive psychotherapy and care coordination. This note was generated in part or whole with voice recognition software. Voice recognition is usually quite accurate but there are transcription errors that can and very often do occur. I apologize for any typographical errors that were not detected and corrected.       Ursula Alert, MD 11/16/2019, 12:47 PM

## 2019-12-30 ENCOUNTER — Other Ambulatory Visit: Payer: Self-pay

## 2019-12-30 ENCOUNTER — Ambulatory Visit (INDEPENDENT_AMBULATORY_CARE_PROVIDER_SITE_OTHER): Payer: 59 | Admitting: Psychiatry

## 2019-12-30 DIAGNOSIS — F411 Generalized anxiety disorder: Secondary | ICD-10-CM

## 2019-12-30 NOTE — Progress Notes (Signed)
Patient picked up phone and said he wanted to reschedule

## 2020-01-24 ENCOUNTER — Ambulatory Visit (INDEPENDENT_AMBULATORY_CARE_PROVIDER_SITE_OTHER): Payer: Self-pay | Admitting: Psychiatry

## 2020-01-24 ENCOUNTER — Other Ambulatory Visit: Payer: Self-pay

## 2020-01-24 DIAGNOSIS — Z5329 Procedure and treatment not carried out because of patient's decision for other reasons: Secondary | ICD-10-CM

## 2020-01-24 NOTE — Progress Notes (Signed)
Attempted to contact patient by phone , Doxy link did not go through . Tried calling both numbers in EHR - left VM.

## 2020-02-17 ENCOUNTER — Other Ambulatory Visit: Payer: Self-pay | Admitting: Psychiatry

## 2020-02-17 DIAGNOSIS — F410 Panic disorder [episodic paroxysmal anxiety] without agoraphobia: Secondary | ICD-10-CM

## 2020-02-17 DIAGNOSIS — F411 Generalized anxiety disorder: Secondary | ICD-10-CM

## 2020-02-17 DIAGNOSIS — F5105 Insomnia due to other mental disorder: Secondary | ICD-10-CM

## 2020-02-21 ENCOUNTER — Telehealth: Payer: Self-pay | Admitting: Psychiatry

## 2020-02-21 DIAGNOSIS — F5105 Insomnia due to other mental disorder: Secondary | ICD-10-CM

## 2020-02-21 DIAGNOSIS — F411 Generalized anxiety disorder: Secondary | ICD-10-CM

## 2020-02-21 DIAGNOSIS — F410 Panic disorder [episodic paroxysmal anxiety] without agoraphobia: Secondary | ICD-10-CM

## 2020-02-21 MED ORDER — AMITRIPTYLINE HCL 50 MG PO TABS: 50.0000 mg | ORAL_TABLET | Freq: Every day | ORAL | 0 refills | Status: DC

## 2020-02-21 NOTE — Telephone Encounter (Signed)
Attempted to contact patient however it went into voicemail that was full-could not leave message.  Returned call to mother-she reported that patient had ran out of medication and would like a refill.  He does have an appointment scheduled with writer end of April.  We will sent Elavil 50 mg at bedtime to pharmacy.

## 2020-03-13 ENCOUNTER — Encounter: Payer: Self-pay | Admitting: Psychiatry

## 2020-03-13 ENCOUNTER — Other Ambulatory Visit: Payer: Self-pay

## 2020-03-13 ENCOUNTER — Telehealth (INDEPENDENT_AMBULATORY_CARE_PROVIDER_SITE_OTHER): Payer: 59 | Admitting: Psychiatry

## 2020-03-13 DIAGNOSIS — Z8659 Personal history of other mental and behavioral disorders: Secondary | ICD-10-CM

## 2020-03-13 DIAGNOSIS — F411 Generalized anxiety disorder: Secondary | ICD-10-CM

## 2020-03-13 DIAGNOSIS — F1210 Cannabis abuse, uncomplicated: Secondary | ICD-10-CM

## 2020-03-13 DIAGNOSIS — F5105 Insomnia due to other mental disorder: Secondary | ICD-10-CM | POA: Diagnosis not present

## 2020-03-13 DIAGNOSIS — F410 Panic disorder [episodic paroxysmal anxiety] without agoraphobia: Secondary | ICD-10-CM | POA: Diagnosis not present

## 2020-03-13 DIAGNOSIS — Z9111 Patient's noncompliance with dietary regimen: Secondary | ICD-10-CM

## 2020-03-13 DIAGNOSIS — Z91199 Patient's noncompliance with other medical treatment and regimen due to unspecified reason: Secondary | ICD-10-CM

## 2020-03-13 NOTE — Progress Notes (Signed)
Provider Location : [facility] Patient Location : Home  Virtual Visit via Video Note  I connected with Terrance Daniels on 03/13/20 at  2:00 PM EDT by a video enabled telemedicine application and verified that I am speaking with the correct person using two identifiers.   I discussed the limitations of evaluation and management by telemedicine and the availability of in person appointments. The patient expressed understanding and agreed to proceed.     I discussed the assessment and treatment plan with the patient. The patient was provided an opportunity to ask questions and all were answered. The patient agreed with the plan and demonstrated an understanding of the instructions.   The patient was advised to call back or seek an in-person evaluation if the symptoms worsen or if the condition fails to improve as anticipated.  [facility] MD OP Progress Note  03/13/2020 3:31 PM Terrance Daniels  MRN:  [id number]  Chief Complaint:  Chief Complaint    Follow-up     HPI: Terrance Daniels is a 24 year old Caucasian male, lives in [place] with his mother, employed, has a history of GAD, panic attacks, insomnia, ADHD per history was evaluated by telemedicine today.  Patient was last seen on 11/16/2019.  Patient has been noncompliant with follow-up sessions as well as psychotherapy sessions.  Patient today returns for an appointment.  He reports overall his anxiety symptoms have improved on the Elavil.  He reports he is compliant on the medication.  He does report he feels jittery if he takes the medication and sleeps.  He reports if he tries to wake up in the middle of the night that is when he feels jittery.  He does not know what could be causing it.  He however reports he does not have a set bedtime and awake at times.  He reports he goes to bed around 2 or 3 AM.  Patient however agrees to work on his sleep hygiene.  He continues to work and reports work is going well.  He denies suicidality, homicidality or  perceptual disturbances.  He does have court hearing coming up for a speeding ticket the end of June.  Patient reports he continues to stay away from cannabis.   Visit Diagnosis:    ICD-10-CM   1. GAD (generalized anxiety disorder)  F41.1   2. Panic attack  F41.0   3. Insomnia due to mental disorder  F51.05   4. Cannabis abuse, episodic  F12.10   5. History of ADHD  Z86.59   6. Noncompliance with treatment plan  Z91.11     Past Psychiatric History: I have reviewed past psychiatric history from my progress note on 01/11/2019.  Past trials of Zoloft, Effexor, Ritalin.  Past Medical History:  Past Medical History:  Diagnosis Date  . ADHD (attention deficit hyperactivity disorder)   . Anxiety     Past Surgical History:  Procedure Laterality Date  . TONSILLECTOMY      Family Psychiatric History: I have reviewed family psychiatric history from my progress note on 01/11/2019  Family History:  Family History  Problem Relation Age of Onset  . Anxiety disorder Mother     Social History: Reviewed social history from my progress note on 01/11/2019 Social History   Socioeconomic History  . Marital status: Single    Spouse name: Not on file  . Number of children: 0  . Years of education: Not on file  . Highest education level: High school graduate  Occupational History  . Not on file  Tobacco Use  . Smoking status: Never Smoker  . Smokeless tobacco: Never Used  Substance and Sexual Activity  . Alcohol use: Not Currently  . Drug use: Not Currently    Types: Marijuana    Comment: about 3 months  . Sexual activity: Yes  Other Topics Concern  . Not on file  Social History Narrative  . Not on file   Social Determinants of Health   Financial Resource Strain:   . Difficulty of Paying Living Expenses:   Food Insecurity:   . Worried About Programme researcher, broadcasting/film/video in the Last Year:   . Barista in the Last Year:   Transportation Needs:   . Freight forwarder  (Medical):   Marland Kitchen Lack of Transportation (Non-Medical):   Physical Activity:   . Days of Exercise per Week:   . Minutes of Exercise per Session:   Stress:   . Feeling of Stress :   Social Connections:   . Frequency of Communication with Friends and Family:   . Frequency of Social Gatherings with Friends and Family:   . Attends Religious Services:   . Active Member of Clubs or Organizations:   . Attends Banker Meetings:   Marland Kitchen Marital Status:     Allergies: No Known Allergies  Metabolic Disorder Labs: No results found for: HGBA1C, MPG No results found for: PROLACTIN No results found for: CHOL, TRIG, HDL, CHOLHDL, VLDL, LDLCALC No results found for: TSH  Therapeutic Level Labs: No results found for: LITHIUM No results found for: VALPROATE No components found for:  CBMZ  Current Medications: Current Outpatient Medications  Medication Sig Dispense Refill  . albuterol (VENTOLIN HFA) 108 (90 Base) MCG/ACT inhaler Inhale into the lungs.    Marland Kitchen amitriptyline (ELAVIL) 50 MG tablet Take 1 tablet (50 mg total) by mouth at bedtime. 90 tablet 0  . budesonide-formoterol (SYMBICORT) 160-4.5 MCG/ACT inhaler Inhale into the lungs.    . ondansetron (ZOFRAN ODT) 4 MG disintegrating tablet Take 1 tablet (4 mg total) by mouth every 8 (eight) hours as needed for nausea or vomiting. 20 tablet 0  . oxyCODONE-acetaminophen (PERCOCET/ROXICET) 5-325 MG tablet Take 1 tablet by mouth every 4 (four) hours as needed for severe pain. 30 tablet 0  . tamsulosin (FLOMAX) 0.4 MG CAPS capsule Take 1 capsule (0.4 mg total) by mouth daily. 14 capsule 0   No current facility-administered medications for this visit.     Musculoskeletal: Strength & Muscle Tone: UTA Gait & Station: normal Patient leans: N/A  Psychiatric Specialty Exam: Review of Systems  Psychiatric/Behavioral: Positive for sleep disturbance. The patient is nervous/anxious.   All other systems reviewed and are negative.   There were  no vitals taken for this visit.There is no height or weight on file to calculate BMI.  General Appearance: Casual  Eye Contact:  Fair  Speech:  Clear and Coherent  Volume:  Normal  Mood:  Anxious  Affect:  Congruent  Thought Process:  Goal Directed and Descriptions of Associations: Intact  Orientation:  Full (Time, Place, and Person)  Thought Content: Logical   Suicidal Thoughts:  No  Homicidal Thoughts:  No  Memory:  Immediate;   Fair Recent;   Fair Remote;   Fair  Judgement:  Fair  Insight:  Fair  Psychomotor Activity:  Normal  Concentration:  Concentration: Fair and Attention Span: Fair  Recall:  [employer] of Knowledge: Fair  Language: Fair  Akathisia:  No  Handed:  Right  AIMS (  if indicated): UTA  Assets:  Communication Skills Desire for Improvement Housing Social Support  ADL's:  Intact  Cognition: WNL  Sleep:  Restless   Screenings:   Assessment and Plan: Terrance Daniels is a 24 year old Caucasian male who has a history of GAD, panic attacks, history of ADHD was evaluated by telemedicine today.  Patient is currently making progress however has been noncompliant with follow-up visits.  Patient will benefit from medication management and psychotherapy sessions.  Plan GAD-improving Elavil 50 mg p.o. daily at bedtime. Patient advised to work on his sleep hygiene and to take the medication at the same time every night. Patient advised to start psychotherapy sessions ,however he has been noncompliant.  Panic attacks-improving Hydroxyzine 12.5 to 25 mg p.o. twice daily as needed  Insomnia-restless Patient does not have a good sleep hygiene techniques.  Provided education  Cannabis use disorder episodic-currently in remission Patient reports he continues to stay away from it.  Noncompliance with treatment plan-patient has been noncompliant with his follow-up visit.  Provided education.  Encouraged compliance.  Follow-up in clinic in 8 weeks or sooner if needed.  I have  spent atleast 20 minutes non face to face with patient today. More than 50 % of the time was spent for preparing to see the patient ( e.g., review of test, records ), ordering medications and test ,psychoeducation and supportive psychotherapy and care coordination,as well as documenting clinical information in electronic health record. This note was generated in part or whole with voice recognition software. Voice recognition is usually quite accurate but there are transcription errors that can and very often do occur. I apologize for any typographical errors that were not detected and corrected.       Ursula Alert, MD 03/13/2020, 3:31 PM

## 2020-05-08 ENCOUNTER — Other Ambulatory Visit: Payer: Self-pay

## 2020-05-08 ENCOUNTER — Telehealth (INDEPENDENT_AMBULATORY_CARE_PROVIDER_SITE_OTHER): Payer: 59 | Admitting: Psychiatry

## 2020-05-08 ENCOUNTER — Encounter: Payer: Self-pay | Admitting: Psychiatry

## 2020-05-08 DIAGNOSIS — F411 Generalized anxiety disorder: Secondary | ICD-10-CM | POA: Diagnosis not present

## 2020-05-08 DIAGNOSIS — F5105 Insomnia due to other mental disorder: Secondary | ICD-10-CM | POA: Diagnosis not present

## 2020-05-08 DIAGNOSIS — F1210 Cannabis abuse, uncomplicated: Secondary | ICD-10-CM | POA: Diagnosis not present

## 2020-05-08 DIAGNOSIS — F410 Panic disorder [episodic paroxysmal anxiety] without agoraphobia: Secondary | ICD-10-CM

## 2020-05-08 DIAGNOSIS — Z8659 Personal history of other mental and behavioral disorders: Secondary | ICD-10-CM

## 2020-05-08 MED ORDER — AMITRIPTYLINE HCL 50 MG PO TABS: 50.0000 mg | ORAL_TABLET | Freq: Every day | ORAL | 0 refills | Status: DC

## 2020-05-08 MED ORDER — HYDROXYZINE HCL 25 MG PO TABS: 12.5000 mg | ORAL_TABLET | Freq: Two times a day (BID) | ORAL | 1 refills | Status: AC | PRN

## 2020-05-08 NOTE — Progress Notes (Signed)
Provider Location : [facility] Patient Location : Home  Virtual Visit via Video Note  I connected with Terrance Daniels on 05/08/20 at  4:40 PM EDT by a video enabled telemedicine application and verified that I am speaking with the correct person using two identifiers.   I discussed the limitations of evaluation and management by telemedicine and the availability of in person appointments. The patient expressed understanding and agreed to proceed.    I discussed the assessment and treatment plan with the patient. The patient was provided an opportunity to ask questions and all were answered. The patient agreed with the plan and demonstrated an understanding of the instructions.   The patient was advised to call back or seek an in-person evaluation if the symptoms worsen or if the condition fails to improve as anticipated.  [facility] MD OP Progress Note  05/08/2020 5:08 PM ARSENIO SCHNORR  MRN:  [id number]  Chief Complaint:  Chief Complaint    Follow-up     HPI: Terrance Daniels is a 24 year old Caucasian male, lives in [place] with his mother, employed, has a history of GAD, panic attacks, insomnia, ADHD per history was evaluated by telemedicine today.  Patient today reports he is currently making progress with regards to his anxiety symptoms. He however continues to have anxiety during the day. Overall he is able to cope with it however he is interested in medication if possible. He is currently compliant on Elavil as prescribed. He denies side effects. Discussed BuSpar which he has not tried in the past. He however reports he would like to discuss this with his mother before making more changes.  Patient reports sleep as good.  Patient denies any suicidality, homicidality or perceptual disturbances.  Patient reports work is going well.  Patient denies any other concerns today.  Visit Diagnosis:    ICD-10-CM   1. GAD (generalized anxiety disorder)  F41.1 amitriptyline (ELAVIL) 50 MG tablet     hydrOXYzine (ATARAX/VISTARIL) 25 MG tablet  2. Panic attack  F41.0 amitriptyline (ELAVIL) 50 MG tablet    hydrOXYzine (ATARAX/VISTARIL) 25 MG tablet  3. Insomnia due to mental disorder  F51.05 amitriptyline (ELAVIL) 50 MG tablet  4. Cannabis abuse, episodic  F12.10   5. History of ADHD  Z86.59     Past Psychiatric History: I have reviewed past psychiatric history from my progress note on 01/11/2019. Past trials of Zoloft, Effexor, Ritalin  Past Medical History:  Past Medical History:  Diagnosis Date  . ADHD (attention deficit hyperactivity disorder)   . Anxiety     Past Surgical History:  Procedure Laterality Date  . TONSILLECTOMY      Family Psychiatric History: I have reviewed family psychiatric history from my progress note on 01/11/2019.  Family History:  Family History  Problem Relation Age of Onset  . Anxiety disorder Mother     Social History: Reviewed social history from my progress note on 01/11/2019. Social History   Socioeconomic History  . Marital status: Single    Spouse name: Not on file  . Number of children: 0  . Years of education: Not on file  . Highest education level: High school graduate  Occupational History  . Not on file  Tobacco Use  . Smoking status: Never Smoker  . Smokeless tobacco: Never Used  Vaping Use  . Vaping Use: Never used  Substance and Sexual Activity  . Alcohol use: Not Currently  . Drug use: Not Currently    Types: Marijuana    Comment:  about 3 months  . Sexual activity: Yes  Other Topics Concern  . Not on file  Social History Narrative  . Not on file   Social Determinants of Health   Financial Resource Strain:   . Difficulty of Paying Living Expenses:   Food Insecurity:   . Worried About Programme researcher, broadcasting/film/video in the Last Year:   . Barista in the Last Year:   Transportation Needs:   . Freight forwarder (Medical):   Marland Kitchen Lack of Transportation (Non-Medical):   Physical Activity:   . Days of Exercise per  Week:   . Minutes of Exercise per Session:   Stress:   . Feeling of Stress :   Social Connections:   . Frequency of Communication with Friends and Family:   . Frequency of Social Gatherings with Friends and Family:   . Attends Religious Services:   . Active Member of Clubs or Organizations:   . Attends Banker Meetings:   Marland Kitchen Marital Status:     Allergies: No Known Allergies  Metabolic Disorder Labs: No results found for: HGBA1C, MPG No results found for: PROLACTIN No results found for: CHOL, TRIG, HDL, CHOLHDL, VLDL, LDLCALC No results found for: TSH  Therapeutic Level Labs: No results found for: LITHIUM No results found for: VALPROATE No components found for:  CBMZ  Current Medications: Current Outpatient Medications  Medication Sig Dispense Refill  . albuterol (VENTOLIN HFA) 108 (90 Base) MCG/ACT inhaler Inhale into the lungs.    Marland Kitchen amitriptyline (ELAVIL) 50 MG tablet Take 1 tablet (50 mg total) by mouth at bedtime. 90 tablet 0  . budesonide-formoterol (SYMBICORT) 160-4.5 MCG/ACT inhaler Inhale into the lungs.    . hydrOXYzine (ATARAX/VISTARIL) 25 MG tablet Take 0.5-1 tablets (12.5-25 mg total) by mouth 2 (two) times daily as needed. 30 tablet 1   No current facility-administered medications for this visit.     Musculoskeletal: Strength & Muscle Tone: UTA Gait & Station: normal Patient leans: N/A  Psychiatric Specialty Exam: Review of Systems  Psychiatric/Behavioral: The patient is nervous/anxious.   All other systems reviewed and are negative.   There were no vitals taken for this visit.There is no height or weight on file to calculate BMI.  General Appearance: Casual  Eye Contact:  Fair  Speech:  Clear and Coherent  Volume:  Normal  Mood:  Anxious  Affect:  Congruent  Thought Process:  Goal Directed and Descriptions of Associations: Intact  Orientation:  Full (Time, Place, and Person)  Thought Content: Logical   Suicidal Thoughts:  No   Homicidal Thoughts:  No  Memory:  Immediate;   Fair Recent;   Fair Remote;   Fair  Judgement:  Fair  Insight:  Fair  Psychomotor Activity:  Normal  Concentration:  Concentration: Fair and Attention Span: Fair  Recall:  [employer] of Knowledge: Fair  Language: Fair  Akathisia:  No  Handed:  Right  AIMS (if indicated): UTA  Assets:  Communication Skills Desire for Improvement Housing Intimacy Social Support Talents/Skills Transportation  ADL's:  Intact  Cognition: WNL  Sleep:  Fair   Screenings:   Assessment and Plan: Terrance Daniels is a Caucasian male who has a history of GAD, panic attacks, history of ADHD was evaluated by telemedicine today. Patient is currently making progress however does have on and off anxiety symptoms. Plan as noted below.  Plan GAD-improving Elavil 50 mg p.o. nightly. Discussed starting BuSpar 5 mg or 7.5 mg p.o. twice  daily. Patient will talk to his mother and Pharmacist, hospital know. Patient was referred for psychotherapy sessions in the past however has been noncompliant  Panic attacks-improving Hydroxyzine 12.5 to 25 mg p.o. twice daily as needed  Insomnia-stable Continue sleep hygiene techniques  Cannabis use disorder in remission We will monitor closely  Follow-up in clinic in 6- 8 weeks or sooner if needed.  I have spent atleast 20 minutes non face to face with patient today. More than 50 % of the time was spent for preparing to see the patient ( e.g., review of test, records ),  ordering medications and test ,psychoeducation and supportive psychotherapy and care coordination,as well as documenting clinical information in electronic health record. This note was generated in part or whole with voice recognition software. Voice recognition is usually quite accurate but there are transcription errors that can and very often do occur. I apologize for any typographical errors that were not detected and corrected.     Jomarie Longs,  MD 05/08/2020, 5:08 PM

## 2020-05-11 ENCOUNTER — Telehealth: Payer: Self-pay

## 2020-05-11 DIAGNOSIS — F411 Generalized anxiety disorder: Secondary | ICD-10-CM

## 2020-05-11 DIAGNOSIS — F410 Panic disorder [episodic paroxysmal anxiety] without agoraphobia: Secondary | ICD-10-CM

## 2020-05-11 MED ORDER — BUSPIRONE HCL 7.5 MG PO TABS: 7.5000 mg | ORAL_TABLET | Freq: Two times a day (BID) | ORAL | 1 refills | Status: AC

## 2020-05-11 NOTE — Telephone Encounter (Signed)
Attempted to reach patient.  Left voicemail.  We will send BuSpar 7.5 mg twice a day as discussed during his last session.

## 2020-05-11 NOTE — Telephone Encounter (Signed)
pt called states that he wants to try the new medication.

## 2020-06-02 ENCOUNTER — Other Ambulatory Visit: Payer: Self-pay | Admitting: Psychiatry

## 2020-06-02 DIAGNOSIS — F411 Generalized anxiety disorder: Secondary | ICD-10-CM

## 2020-06-02 DIAGNOSIS — F410 Panic disorder [episodic paroxysmal anxiety] without agoraphobia: Secondary | ICD-10-CM

## 2020-06-10 ENCOUNTER — Other Ambulatory Visit: Payer: Self-pay | Admitting: Psychiatry

## 2020-06-10 DIAGNOSIS — F5105 Insomnia due to other mental disorder: Secondary | ICD-10-CM

## 2020-06-10 DIAGNOSIS — F410 Panic disorder [episodic paroxysmal anxiety] without agoraphobia: Secondary | ICD-10-CM

## 2020-06-10 DIAGNOSIS — F411 Generalized anxiety disorder: Secondary | ICD-10-CM

## 2020-12-20 ENCOUNTER — Other Ambulatory Visit: Payer: Self-pay | Admitting: Psychiatry

## 2020-12-20 DIAGNOSIS — F410 Panic disorder [episodic paroxysmal anxiety] without agoraphobia: Secondary | ICD-10-CM

## 2020-12-20 DIAGNOSIS — F5105 Insomnia due to other mental disorder: Secondary | ICD-10-CM

## 2020-12-20 DIAGNOSIS — F411 Generalized anxiety disorder: Secondary | ICD-10-CM

## 2021-03-29 ENCOUNTER — Other Ambulatory Visit: Payer: Self-pay | Admitting: Family Medicine

## 2021-03-29 DIAGNOSIS — R251 Tremor, unspecified: Secondary | ICD-10-CM

## 2021-03-29 DIAGNOSIS — R0789 Other chest pain: Secondary | ICD-10-CM

## 2021-03-29 DIAGNOSIS — R42 Dizziness and giddiness: Secondary | ICD-10-CM

## 2021-03-29 DIAGNOSIS — R519 Headache, unspecified: Secondary | ICD-10-CM

## 2021-04-13 ENCOUNTER — Ambulatory Visit: Payer: Managed Care, Other (non HMO)

## 2021-04-26 ENCOUNTER — Ambulatory Visit
Admission: RE | Admit: 2021-04-26 | Discharge: 2021-04-26 | Disposition: A | Discharge status: Home / Self Care | Payer: Managed Care, Other (non HMO) | Source: Ambulatory Visit | Attending: Family Medicine | Admitting: Family Medicine

## 2021-04-26 DIAGNOSIS — R519 Headache, unspecified: Secondary | ICD-10-CM

## 2021-04-26 DIAGNOSIS — R251 Tremor, unspecified: Secondary | ICD-10-CM

## 2021-04-26 DIAGNOSIS — R0789 Other chest pain: Secondary | ICD-10-CM

## 2021-04-26 DIAGNOSIS — R42 Dizziness and giddiness: Secondary | ICD-10-CM

## 2021-04-26 MED ORDER — GADOBENATE DIMEGLUMINE 529 MG/ML IV SOLN
14.0000 mL | Freq: Once | INTRAVENOUS | Status: AC | PRN
  Administered 2021-04-26: 14 mL via INTRAVENOUS

## 2021-06-14 ENCOUNTER — Other Ambulatory Visit: Payer: Self-pay | Admitting: Gastroenterology

## 2021-06-14 ENCOUNTER — Other Ambulatory Visit (HOSPITAL_COMMUNITY): Payer: Self-pay | Admitting: Gastroenterology

## 2021-06-14 DIAGNOSIS — R1013 Epigastric pain: Secondary | ICD-10-CM

## 2021-07-01 ENCOUNTER — Other Ambulatory Visit: Payer: Self-pay

## 2021-07-01 ENCOUNTER — Ambulatory Visit
Admission: EM | Admit: 2021-07-01 | Discharge: 2021-07-01 | Discharge status: Against Medical Advice | Payer: Managed Care, Other (non HMO)

## 2022-09-12 ENCOUNTER — Emergency Department: Payer: Managed Care, Other (non HMO)

## 2022-09-12 ENCOUNTER — Emergency Department
Admission: EM | Admit: 2022-09-12 | Discharge: 2022-09-12 | Disposition: A | Discharge status: Home / Self Care | Payer: Managed Care, Other (non HMO) | Attending: Emergency Medicine | Admitting: Emergency Medicine

## 2022-09-12 ENCOUNTER — Other Ambulatory Visit: Payer: Self-pay

## 2022-09-12 DIAGNOSIS — J45990 Exercise induced bronchospasm: Secondary | ICD-10-CM | POA: Insufficient documentation

## 2022-09-12 DIAGNOSIS — R1084 Generalized abdominal pain: Secondary | ICD-10-CM

## 2022-09-12 DIAGNOSIS — K529 Noninfective gastroenteritis and colitis, unspecified: Secondary | ICD-10-CM | POA: Diagnosis not present

## 2022-09-12 DIAGNOSIS — J3489 Other specified disorders of nose and nasal sinuses: Secondary | ICD-10-CM | POA: Diagnosis not present

## 2022-09-12 DIAGNOSIS — R1032 Left lower quadrant pain: Secondary | ICD-10-CM | POA: Diagnosis present

## 2022-09-12 LAB — COMPREHENSIVE METABOLIC PANEL
ALT: 16 U/L (ref 0–44)
AST: 17 U/L (ref 15–41)
Albumin: 5.1 g/dL — ABNORMAL HIGH (ref 3.5–5.0)
Alkaline Phosphatase: 61 U/L (ref 38–126)
Anion gap: 8 (ref 5–15)
BUN: 13 mg/dL (ref 6–20)
CO2: 24 mmol/L (ref 22–32)
Calcium: 9.2 mg/dL (ref 8.9–10.3)
Chloride: 106 mmol/L (ref 98–111)
Creatinine, Ser: 0.85 mg/dL (ref 0.61–1.24)
GFR, Estimated: >60 mL/min (ref >60)
Glucose, Bld: 125 mg/dL — ABNORMAL HIGH (ref 70–99)
Potassium: 3.5 mmol/L (ref 3.5–5.1)
Sodium: 138 mmol/L (ref 135–145)
Total Bilirubin: 1.1 mg/dL (ref 0.3–1.2)
Total Protein: 8.1 g/dL (ref 6.5–8.1)

## 2022-09-12 LAB — CBC
HCT: 47.1 % (ref 39.0–52.0)
Hemoglobin: 16.4 g/dL (ref 13.0–17.0)
MCH: 30.8 pg (ref 26.0–34.0)
MCHC: 34.8 g/dL (ref 30.0–36.0)
MCV: 88.4 fL (ref 80.0–100.0)
Platelets: 343 10*3/uL (ref 150–400)
RBC: 5.33 MIL/uL (ref 4.22–5.81)
RDW: 11.9 % (ref 11.5–15.5)
WBC: 12.9 10*3/uL — ABNORMAL HIGH (ref 4.0–10.5)
nRBC: 0 % (ref 0.0–0.2)

## 2022-09-12 LAB — LIPASE, BLOOD: Lipase: 30 U/L (ref 11–51)

## 2022-09-12 MED ORDER — ONDANSETRON 4 MG PO TBDP
4.0000 mg | ORAL_TABLET | Freq: Once | ORAL | Status: AC | PRN
  Administered 2022-09-12: 4 mg via ORAL
  Filled 2022-09-12: qty 1

## 2022-09-12 MED ORDER — NAPROXEN 500 MG PO TABS
500.0000 mg | ORAL_TABLET | Freq: Once | ORAL | Status: AC
  Administered 2022-09-12: 500 mg via ORAL
  Filled 2022-09-12: qty 1

## 2022-09-12 NOTE — ED Triage Notes (Signed)
Pt to ED via [facility]. Pt initial seen for LLQ abdominal pain with radiation to the back. PT reports hx kidney stones. [place] reports pt got anxious and diaphoretic and HR increased to 140s. Pt denies urinary symptoms. Pt reports has adjustment with chiropractor this morning and pain got worse after.

## 2022-09-12 NOTE — ED Notes (Signed)
DC instructions given verbally and in writing.. understanding voiced, signature obtained. Pt left in st able condition with family. 

## 2022-09-12 NOTE — Discharge Instructions (Addendum)
Please use naproxen (Naprosyn) up to 500 mg every 12 hours and/or acetaminophen (Tylenol) up to 4 g/day for any continued pain

## 2022-09-12 NOTE — ED Provider Notes (Signed)
[facility] Provider Note   Event Date/Time   First MD Initiated Contact with Patient 09/12/22 1914     (approximate) History  Abdominal Pain and Tachycardia  HPI Terrance Daniels is a 26 y.o. male with a stated past medical history of generalized anxiety disorder and exercise-induced asthma who presents for left lower quadrant abdominal pain that radiates around to the back with a reported history of kidney stones that it felt the same in the past.  Patient was at [facility] clinic when he became anxious, diaphoretic, with heart rate increased to the 140s.  Patient states that he had adjustment with his chiropractor this morning which caused his pain to get worse.  Patient also states that he has been having clear rhinorrhea and productive cough over the last 5 days.  Patient also endorses nausea over the last 24 hours. ROS: Patient currently denies any vision changes, tinnitus, difficulty speaking, facial droop, sore throat, chest pain, shortness of breath, vomiting/diarrhea, dysuria, or weakness/numbness/paresthesias in any extremity   Physical Exam  Triage Vital Signs: ED Triage Vitals  Enc Vitals Group     BP 09/12/22 1854 135/81     Pulse Rate 09/12/22 1854 (!) 110     Resp 09/12/22 1854 18     Temp 09/12/22 1854 98.2 F (36.8 C)     Temp Source 09/12/22 1854 Oral     SpO2 09/12/22 1854 97 %     Weight 09/12/22 1855 177 lb (80.3 kg)     Height 09/12/22 1855 5\' 9"  (1.753 m)     Head Circumference --      Peak Flow --      Pain Score 09/12/22 1854 4     Pain Loc --      Pain Edu? --      Excl. in [place]? --    Most recent vital signs: Vitals:   09/12/22 2126 09/12/22 2135  BP: 135/85 132/75  Pulse: (!) 104 (!) 107  Resp:  18  Temp: 98.7 F (37.1 C) 98.8 F (37.1 C)  SpO2: 98% 100%   General: Awake, oriented x4. CV:  Good peripheral perfusion.  Resp:  Normal effort.  Abd:  No distention.  Generalized tenderness to palpation Other:  Young adult  Caucasian male laying in bed in no acute distress ED Results / Procedures / Treatments  Labs (all labs ordered are listed, but only abnormal results are displayed) Labs Reviewed  COMPREHENSIVE METABOLIC PANEL - Abnormal; Notable for the following components:      Result Value   Glucose, Bld 125 (*)    Albumin 5.1 (*)    All other components within normal limits  CBC - Abnormal; Notable for the following components:   WBC 12.9 (*)    All other components within normal limits  LIPASE, BLOOD  URINALYSIS, ROUTINE W REFLEX MICROSCOPIC   EKG ED ECG REPORT I, Naaman Plummer, the attending physician, personally viewed and interpreted this ECG. Date: 09/12/2022 EKG Time: 1859 Rate: 104 Rhythm: Tachycardic sinus rhythm QRS Axis: normal Intervals: normal ST/T Wave abnormalities: normal Narrative Interpretation: Tachycardic sinus rhythm.  No evidence of acute ischemia RADIOLOGY ED MD interpretation: CT of the abdomen and pelvis without contrast shows fluid-filled loops of small bowel suspicious for enteritis -Agree with radiology assessment Official radiology report(s): CT Renal Stone Study  Addendum Date: 09/12/2022   ADDENDUM REPORT: 09/12/2022 20:09 ADDENDUM: Punctate stone in the right upper pole is noted without obstructive change. Electronically Signed  By: Inez Catalina M.D.   On: 09/12/2022 20:09   Result Date: 09/12/2022 CLINICAL DATA:  Left-sided flank pain and history of kidney stones EXAM: CT ABDOMEN AND PELVIS WITHOUT CONTRAST TECHNIQUE: Multidetector CT imaging of the abdomen and pelvis was performed following the standard protocol without IV contrast. RADIATION DOSE REDUCTION: This exam was performed according to the departmental dose-optimization program which includes automated exposure control, adjustment of the mA and/or kV according to patient size and/or use of iterative reconstruction technique. COMPARISON:  09/17/2019 FINDINGS: Lower chest: No acute abnormality.  Hepatobiliary: No focal liver abnormality is seen. No gallstones, gallbladder wall thickening, or biliary dilatation. Pancreas: Unremarkable. No pancreatic ductal dilatation or surrounding inflammatory changes. Spleen: Normal in size without focal abnormality. Adrenals/Urinary Tract: Adrenal glands are within normal limits. Kidneys are well visualized bilaterally. Punctate stone in the right upper pole is noted. No obstructive changes are seen. The ureters are within normal limits. The bladder is partially distended. Stomach/Bowel: The appendix is well visualized and within normal limits. No obstructive or inflammatory changes of the colon are seen. Stomach is significantly distended with ingested food stuffs. Small bowel demonstrates multiple fluid-filled loops without obstructive change. This may represent some mild enteritis. Vascular/Lymphatic: No significant vascular findings are present. Mildly prominent mesenteric nodes are seen which may represent some mesenteric adenitis. Reproductive: Prostate is unremarkable. Other: No abdominal wall hernia or abnormality. No abdominopelvic ascites. Musculoskeletal: No acute or significant osseous findings. IMPRESSION: Fluid-filled loops of small bowel suspicious for enteritis. No obstructive changes are seen. Mildly prominent mesenteric lymph nodes as described. Electronically Signed: By: Inez Catalina M.D. On: 09/12/2022 19:49   PROCEDURES: Critical Care performed: No .1-3 Lead EKG Interpretation  Performed by: Naaman Plummer, MD Authorized by: Naaman Plummer, MD     Interpretation: abnormal     ECG rate:  102   ECG rate assessment: tachycardic     Rhythm: sinus tachycardia     Ectopy: none     Conduction: normal    MEDICATIONS ORDERED IN ED: Medications  ondansetron (ZOFRAN-ODT) disintegrating tablet 4 mg (4 mg Oral Given 09/12/22 1858)  naproxen (NAPROSYN) tablet 500 mg (500 mg Oral Given 09/12/22 2124)   IMPRESSION / MDM / ASSESSMENT AND PLAN / ED  COURSE  I reviewed the triage vital signs and the nursing notes.                             The patient is on the cardiac monitor to evaluate for evidence of arrhythmia and/or significant heart rate changes. Patient's presentation is most consistent with acute presentation with potential threat to life or bodily function. Patient presents for abdominal pain.  Differential diagnosis includes appendicitis, abdominal aortic aneurysm, surgical biliary disease, pancreatitis, SBO, mesenteric ischemia, serious intra-abdominal bacterial illness, genital torsion. Doubt atypical ACS. Based on history, physical exam, radiologic/laboratory evaluation, there is no red flag results or symptomatology requiring emergent intervention or need for admission at this time Pt tolerating PO. Disposition: Patient will be discharged with strict return precautions and follow up with primary MD within 12-24 hours for further evaluation. Patient understands that this still may have an early presentation of an emergent medical condition such as appendicitis that will require a recheck.   FINAL CLINICAL IMPRESSION(S) / ED DIAGNOSES   Final diagnoses:  Generalized abdominal pain  Enteritis   Rx / DC Orders   ED Discharge Orders     None  Note:  This document was prepared using Dragon voice recognition software and may include unintentional dictation errors.   Naaman Plummer, MD 09/12/22 2322

## 2022-09-12 NOTE — ED Notes (Signed)
Pt taken to CT scan via stretcher

## 2022-12-27 IMAGING — MR MR HEAD WO/W CM
13 series · 48 of 48 positions shown · IV contrast (14ml Multihance)
Comparison: None.

CLINICAL DATA: Occipital region headaches. Dizziness. Blurred
vision. Body tremors.

EXAM:
MRI HEAD WITHOUT AND WITH CONTRAST
TECHNIQUE: Multiplanar, multiecho pulse sequences of the brain and surrounding
structures were obtained without and with intravenous contrast.
CONTRAST:  14mL MULTIHANCE GADOBENATE DIMEGLUMINE 529 MG/ML IV SOLN

[Series 2: T1 · sagittal · 5.0mm · 0.45mm/px · 2 of 25 slices shown]
[im 1/25]
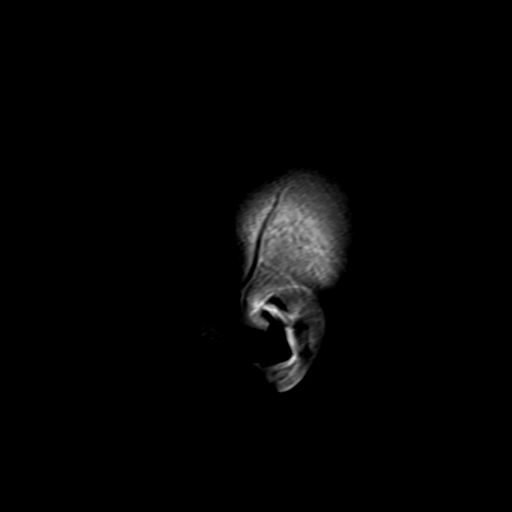
[im 25/25]
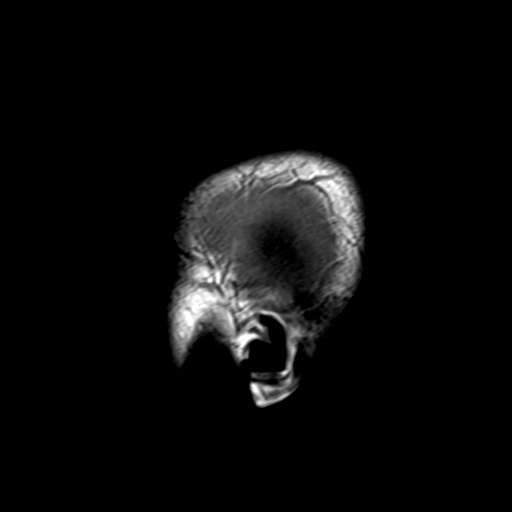

[Series 3: ax ep2d_diff_3 · axial · 3.0mm · 1.80mm/px · z∈[-65,+91]mm · 6 of 106 slices shown]
[im 1/106]
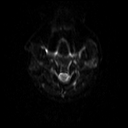
[im 22/106]
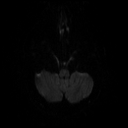
[im 43/106]
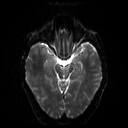
[im 64/106]
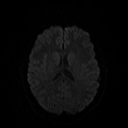
[im 85/106]
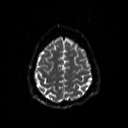
[im 106/106]
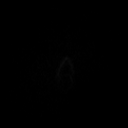

[Series 4: ax ep2d_diff_3_adc · axial · 3.0mm · 1.80mm/px · z∈[-65,+91]mm · 3 of 52 slices shown]
[im 1/52]
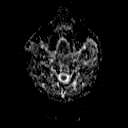
[im 26/52]
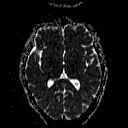
[im 52/52]
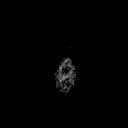

[Series 5: cor ep2d_diff · coronal · 5.0mm · 1.77mm/px · 3 of 60 slices shown]
[im 1/60]
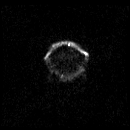
[im 30/60]
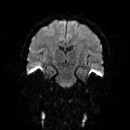
[im 60/60]
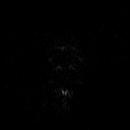

[Series 6: cor ep2d_diff_adc · coronal · 5.0mm · 1.77mm/px · 2 of 30 slices shown]
[im 1/30]
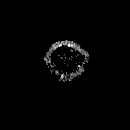
[im 30/30]
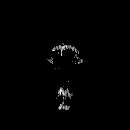

[Series 8: swi_images · axial · 2.0mm · 0.98mm/px · z∈[-65,+93]mm · 5 of 80 slices shown]
[im 1/80]
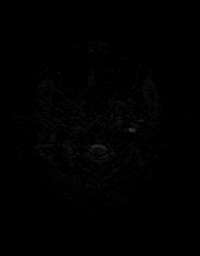
[im 20/80]
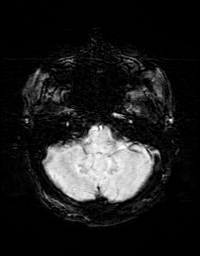
[im 40/80]
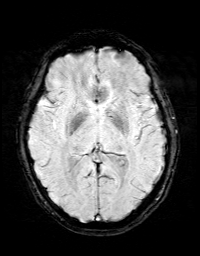
[im 60/80]
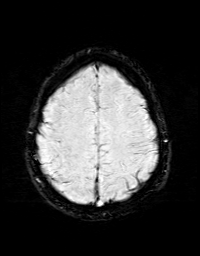
[im 80/80]
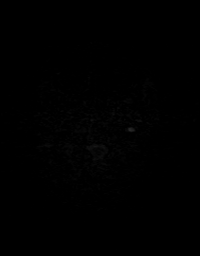

[Series 9: FLAIR · axial · 3.0mm · 0.49mm/px · z∈[-64,+88]mm · 2 of 40 slices shown (1 of 2)]
[im 1/40]
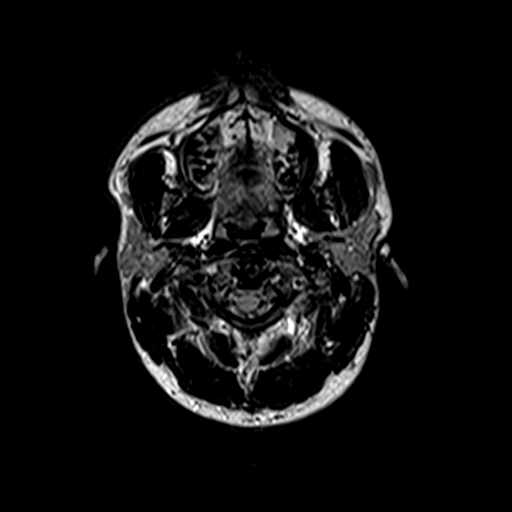
[im 40/40]
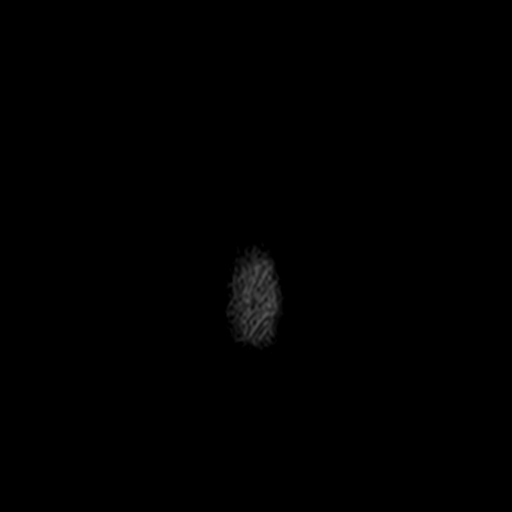

[Series 10: T2 · axial · 5.0mm · 0.78mm/px · z∈[-64,+92]mm · 2 of 27 slices shown (1 of 2)]
[im 1/27]
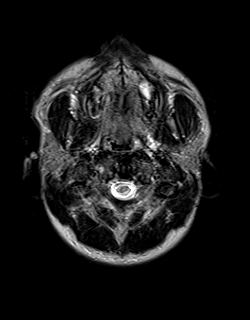
[im 27/27]
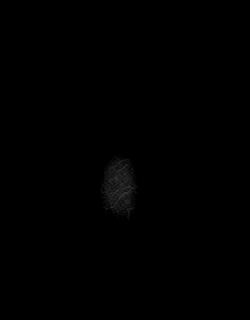

[Series 11: t1_mpr_tra · axial · 1.0mm · 0.78mm/px · z∈[-64,+95]mm · 9 of 160 slices shown]
[im 1/160]
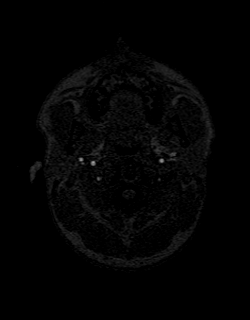
[im 20/160]
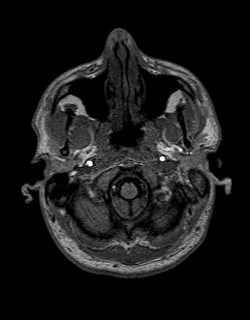
[im 40/160]
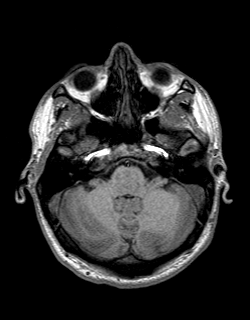
[im 60/160]
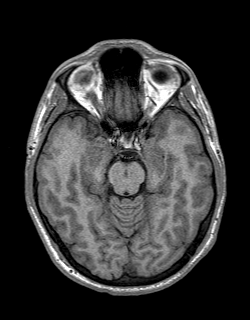
[im 80/160]
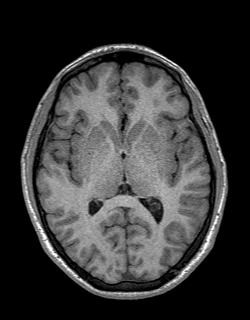
[im 100/160]
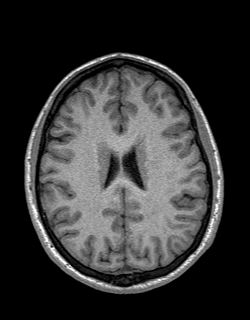
[im 120/160]
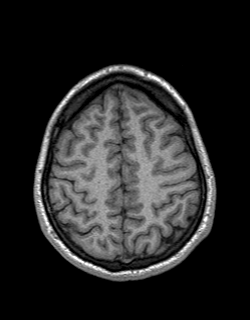
[im 140/160]
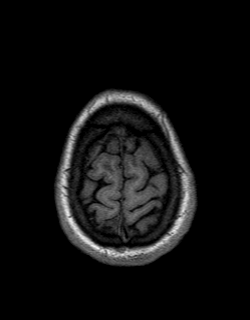
[im 160/160]
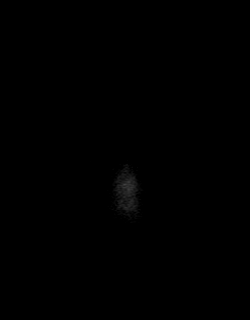

[Series 12: FLAIR · axial · 3.0mm · 0.49mm/px · z∈[-64,+88]mm · 2 of 40 slices shown (2 of 2)]
[im 1/40]
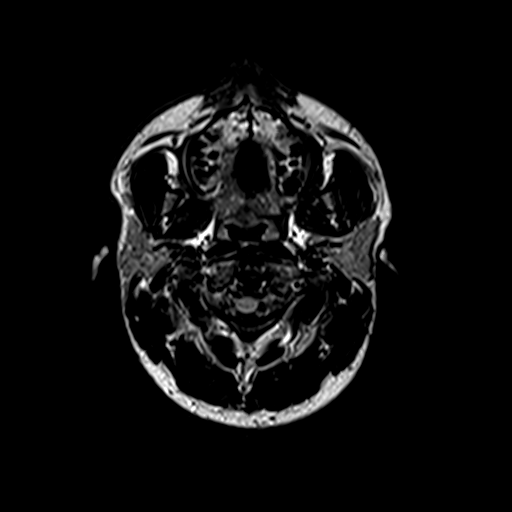
[im 40/40]
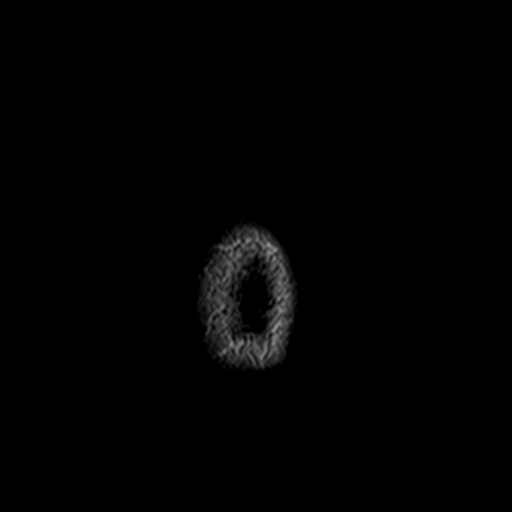

[Series 13: T2 · coronal · 5.0mm · 0.45mm/px · 2 of 30 slices shown (2 of 2)]
[im 1/30]
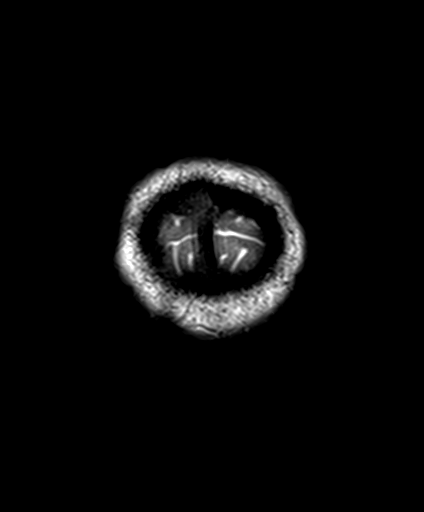
[im 30/30]
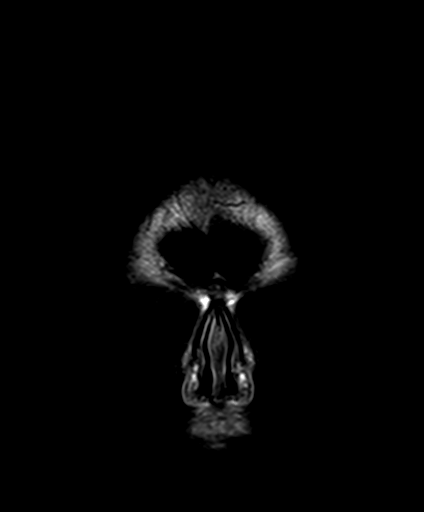

[Series 14: T1 post-contrast · coronal · 5.0mm · 0.72mm/px · 1 of 26 slices shown]
[im 1/26]
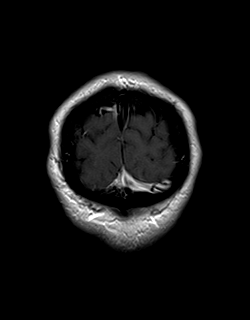

[Series 15: post t1_mpr_tra · axial · 1.0mm · 0.78mm/px · z∈[-64,+95]mm · 9 of 160 slices shown]
[im 1/160]
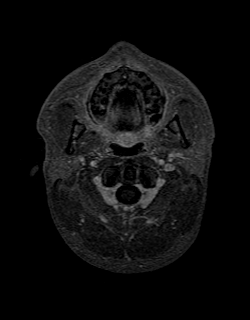
[im 20/160]
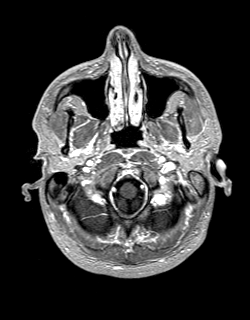
[im 40/160]
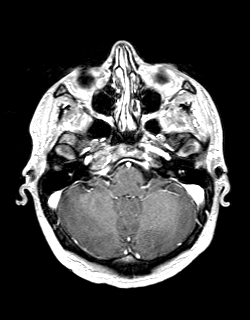
[im 60/160]
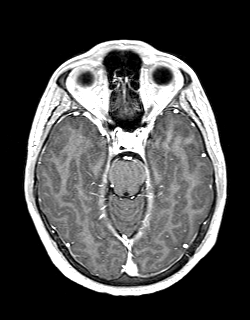
[im 80/160]
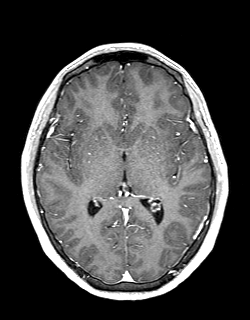
[im 100/160]
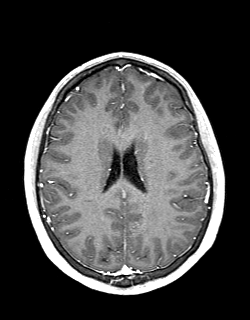
[im 120/160]
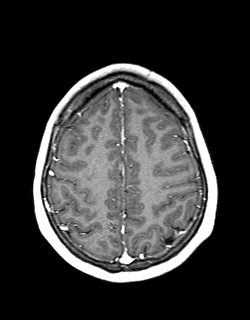
[im 140/160]
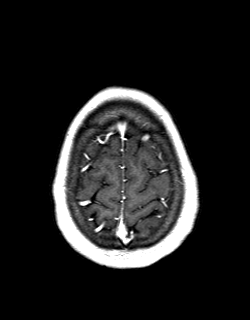
[im 160/160]
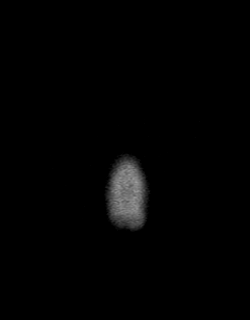

[48 of 48 positions shown; findings below may reference images not displayed]

FINDINGS: Brain: The brain has a normal appearance without evidence of
malformation, atrophy, old or acute small or large vessel
infarction, mass lesion, hemorrhage, hydrocephalus or extra-axial
collection. After contrast administration, no abnormal enhancement
occurs.

Vascular: Major vessels at the base of the brain show flow. Venous
sinuses appear patent.

Skull and upper cervical spine: Normal.

Sinuses/Orbits: Clear/normal.

Other: None significant.
IMPRESSION: Normal examination. No abnormality seen to explain the clinical
presentation.

## 2024-04-29 DIAGNOSIS — Z419 Encounter for procedure for purposes other than remedying health state, unspecified: Secondary | ICD-10-CM | POA: Diagnosis not present

## 2024-05-29 DIAGNOSIS — Z419 Encounter for procedure for purposes other than remedying health state, unspecified: Secondary | ICD-10-CM | POA: Diagnosis not present

## 2024-06-29 DIAGNOSIS — Z419 Encounter for procedure for purposes other than remedying health state, unspecified: Secondary | ICD-10-CM | POA: Diagnosis not present
